# Patient Record
Sex: Male | Born: 1980 | Race: Black or African American | Hispanic: No | Marital: Single | Smoking: Current every day smoker
Health system: Southern US, Community
[De-identification: ages and names within clinical notes are randomized; demographics above are authoritative.]

## PROBLEM LIST (undated history)

## (undated) DIAGNOSIS — S199XXA Unspecified injury of neck, initial encounter: Secondary | ICD-10-CM

## (undated) DIAGNOSIS — F39 Unspecified mood [affective] disorder: Secondary | ICD-10-CM

## (undated) HISTORY — PX: NO PAST SURGERIES: SHX2092

---
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
## 2000-08-23 ENCOUNTER — Emergency Department (HOSPITAL_COMMUNITY): Admission: EM | Admit: 2000-08-23 | Discharge: 2000-08-23 | Payer: Self-pay | Admitting: Emergency Medicine

## 2002-01-08 ENCOUNTER — Inpatient Hospital Stay (HOSPITAL_COMMUNITY): Admission: AC | Admit: 2002-01-08 | Discharge: 2002-01-26 | Payer: Self-pay

## 2002-01-08 ENCOUNTER — Encounter: Payer: Self-pay | Admitting: General Surgery

## 2002-01-08 ENCOUNTER — Encounter: Payer: Self-pay | Admitting: Emergency Medicine

## 2002-01-14 ENCOUNTER — Encounter: Payer: Self-pay | Admitting: Pulmonary Disease

## 2002-01-21 ENCOUNTER — Encounter: Payer: Self-pay | Admitting: General Surgery

## 2002-01-26 ENCOUNTER — Inpatient Hospital Stay (HOSPITAL_COMMUNITY)
Admission: RE | Admit: 2002-01-26 | Discharge: 2002-02-18 | Payer: Self-pay | Admitting: Physical Medicine & Rehabilitation

## 2002-01-28 ENCOUNTER — Encounter: Payer: Self-pay | Admitting: Physical Medicine & Rehabilitation

## 2002-03-01 ENCOUNTER — Encounter: Admission: RE | Admit: 2002-03-01 | Discharge: 2002-03-01 | Payer: Self-pay | Admitting: Neurosurgery

## 2002-03-01 ENCOUNTER — Encounter: Payer: Self-pay | Admitting: Neurosurgery

## 2002-04-07 ENCOUNTER — Encounter: Admission: RE | Admit: 2002-04-07 | Discharge: 2002-04-07 | Payer: Self-pay | Admitting: Internal Medicine

## 2002-04-13 ENCOUNTER — Encounter: Payer: Self-pay | Admitting: Neurosurgery

## 2002-04-13 ENCOUNTER — Encounter: Admission: RE | Admit: 2002-04-13 | Discharge: 2002-04-13 | Payer: Self-pay | Admitting: Neurosurgery

## 2002-04-16 ENCOUNTER — Encounter: Payer: Self-pay | Admitting: Neurosurgery

## 2002-04-16 ENCOUNTER — Ambulatory Visit (HOSPITAL_COMMUNITY): Admission: RE | Admit: 2002-04-16 | Discharge: 2002-04-16 | Payer: Self-pay | Admitting: *Deleted

## 2002-05-16 ENCOUNTER — Emergency Department (HOSPITAL_COMMUNITY): Admission: EM | Admit: 2002-05-16 | Discharge: 2002-05-16 | Payer: Self-pay | Admitting: Emergency Medicine

## 2002-09-17 ENCOUNTER — Encounter
Admission: RE | Admit: 2002-09-17 | Discharge: 2002-12-16 | Payer: Self-pay | Admitting: Physical Medicine & Rehabilitation

## 2004-02-08 ENCOUNTER — Encounter: Admission: RE | Admit: 2004-02-08 | Discharge: 2004-02-08 | Payer: Self-pay | Admitting: Neurosurgery

## 2004-04-17 ENCOUNTER — Emergency Department (HOSPITAL_COMMUNITY): Admission: EM | Admit: 2004-04-17 | Discharge: 2004-04-17 | Payer: Self-pay | Admitting: Family Medicine

## 2004-06-27 ENCOUNTER — Emergency Department (HOSPITAL_COMMUNITY): Admission: EM | Admit: 2004-06-27 | Discharge: 2004-06-27 | Payer: Self-pay | Admitting: Emergency Medicine

## 2004-07-18 ENCOUNTER — Ambulatory Visit (HOSPITAL_COMMUNITY): Admission: RE | Admit: 2004-07-18 | Discharge: 2004-07-18 | Payer: Self-pay | Admitting: Orthopaedic Surgery

## 2004-08-19 ENCOUNTER — Emergency Department (HOSPITAL_COMMUNITY): Admission: EM | Admit: 2004-08-19 | Discharge: 2004-08-19 | Payer: Self-pay | Admitting: Emergency Medicine

## 2005-11-01 ENCOUNTER — Emergency Department (HOSPITAL_COMMUNITY): Admission: EM | Admit: 2005-11-01 | Discharge: 2005-11-01 | Payer: Self-pay

## 2005-11-11 ENCOUNTER — Emergency Department (HOSPITAL_COMMUNITY): Admission: EM | Admit: 2005-11-11 | Discharge: 2005-11-11 | Payer: Self-pay | Admitting: Emergency Medicine

## 2005-11-25 ENCOUNTER — Emergency Department (HOSPITAL_COMMUNITY): Admission: EM | Admit: 2005-11-25 | Discharge: 2005-11-25 | Payer: Self-pay | Admitting: Emergency Medicine

## 2005-11-26 ENCOUNTER — Emergency Department (HOSPITAL_COMMUNITY): Admission: EM | Admit: 2005-11-26 | Discharge: 2005-11-26 | Payer: Self-pay | Admitting: Emergency Medicine

## 2008-10-18 ENCOUNTER — Emergency Department (HOSPITAL_COMMUNITY): Admission: EM | Admit: 2008-10-18 | Discharge: 2008-10-18 | Payer: Self-pay | Admitting: Emergency Medicine

## 2010-04-13 ENCOUNTER — Ambulatory Visit: Admit: 2010-04-13 | Payer: Self-pay | Admitting: Nurse Practitioner

## 2010-06-24 LAB — URINALYSIS, ROUTINE W REFLEX MICROSCOPIC
Glucose, UA: NEGATIVE mg/dL
Protein, ur: NEGATIVE mg/dL
Specific Gravity, Urine: 1.021 (ref 1.005–1.030)

## 2010-06-24 LAB — COMPREHENSIVE METABOLIC PANEL
AST: 16 U/L (ref 0–37)
BUN: 7 mg/dL (ref 6–23)
CO2: 28 mEq/L (ref 19–32)
Chloride: 104 mEq/L (ref 96–112)
Creatinine, Ser: 0.61 mg/dL (ref 0.4–1.5)
GFR calc non Af Amer: >60 mL/min (ref >60)
Glucose, Bld: 86 mg/dL (ref 70–99)

## 2010-06-24 LAB — DIFFERENTIAL
Basophils Absolute: 0 10*3/uL (ref 0.0–0.1)
Eosinophils Relative: 1 % (ref 0–5)
Lymphocytes Relative: 24 % (ref 12–46)
Lymphs Abs: 1.5 10*3/uL (ref 0.7–4.0)
Monocytes Absolute: 0.3 10*3/uL (ref 0.1–1.0)
Neutro Abs: 4.3 10*3/uL (ref 1.7–7.7)
Neutrophils Relative %: 70 % (ref 43–77)

## 2010-06-24 LAB — CBC
HCT: 37.5 % — ABNORMAL LOW (ref 39.0–52.0)
Hemoglobin: 12.4 g/dL — ABNORMAL LOW (ref 13.0–17.0)
MCHC: 33 g/dL (ref 30.0–36.0)
MCV: 81.5 fL (ref 78.0–100.0)
RBC: 4.61 MIL/uL (ref 4.22–5.81)

## 2010-06-24 LAB — HEMOCCULT GUIAC POC 1CARD (OFFICE): Fecal Occult Bld: POSITIVE

## 2011-07-13 ENCOUNTER — Encounter (HOSPITAL_COMMUNITY): Payer: Self-pay | Admitting: *Deleted

## 2011-07-13 ENCOUNTER — Emergency Department (INDEPENDENT_AMBULATORY_CARE_PROVIDER_SITE_OTHER): Payer: Medicaid Other

## 2011-07-13 ENCOUNTER — Emergency Department (HOSPITAL_COMMUNITY)
Admission: EM | Admit: 2011-07-13 | Discharge: 2011-07-13 | Disposition: A | Discharge status: Home / Self Care | Payer: Medicaid Other | Source: Home / Self Care | Attending: Emergency Medicine | Admitting: Emergency Medicine

## 2011-07-13 DIAGNOSIS — M62838 Other muscle spasm: Secondary | ICD-10-CM

## 2011-07-13 HISTORY — DX: Unspecified injury of neck, initial encounter: S19.9XXA

## 2011-07-13 MED ORDER — IBUPROFEN 600 MG PO TABS: 600.0000 mg | ORAL_TABLET | Freq: Three times a day (TID) | ORAL | Status: DC | PRN

## 2011-07-13 MED ORDER — CYCLOBENZAPRINE HCL 10 MG PO TABS: 10.0000 mg | ORAL_TABLET | Freq: Two times a day (BID) | ORAL | Status: AC | PRN

## 2011-07-13 MED ORDER — ACETAMINOPHEN-CODEINE #3 300-30 MG PO TABS: 1.0000 | ORAL_TABLET | Freq: Four times a day (QID) | ORAL | Status: AC | PRN

## 2011-07-13 MED ORDER — IBUPROFEN 600 MG PO TABS: 600.0000 mg | ORAL_TABLET | Freq: Three times a day (TID) | ORAL | Status: AC | PRN

## 2011-07-13 MED ORDER — CYCLOBENZAPRINE HCL 10 MG PO TABS: 10.0000 mg | ORAL_TABLET | Freq: Two times a day (BID) | ORAL | Status: DC | PRN

## 2011-07-13 NOTE — Discharge Instructions (Signed)
Will contact you if any acute findings in your neck x-rays. Otherwise she can call us back and we can fax the results to your new primary care provider. Take the prescribed medications as instructed. Be aware that Flexeril and Tylenol #3 can make you feel drowsy and he should not drive after taking this medications. Followup with your primary care provider or spine specialist  if persistent or worsening symptoms. Can go to the emergency department if worsening symptoms despite following treatment.
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
## 2012-08-27 ENCOUNTER — Encounter (HOSPITAL_COMMUNITY): Payer: Self-pay | Admitting: Emergency Medicine

## 2012-08-27 ENCOUNTER — Emergency Department (HOSPITAL_COMMUNITY): Payer: Medicaid Other

## 2012-08-27 ENCOUNTER — Emergency Department (HOSPITAL_COMMUNITY)
Admission: EM | Admit: 2012-08-27 | Discharge: 2012-08-27 | Disposition: A | Discharge status: Home / Self Care | Payer: Medicaid Other | Attending: Emergency Medicine | Admitting: Emergency Medicine

## 2012-08-27 DIAGNOSIS — S40012A Contusion of left shoulder, initial encounter: Secondary | ICD-10-CM

## 2012-08-27 DIAGNOSIS — Z87828 Personal history of other (healed) physical injury and trauma: Secondary | ICD-10-CM | POA: Insufficient documentation

## 2012-08-27 DIAGNOSIS — IMO0002 Reserved for concepts with insufficient information to code with codable children: Secondary | ICD-10-CM | POA: Insufficient documentation

## 2012-08-27 DIAGNOSIS — Y92410 Unspecified street and highway as the place of occurrence of the external cause: Secondary | ICD-10-CM | POA: Insufficient documentation

## 2012-08-27 DIAGNOSIS — F17200 Nicotine dependence, unspecified, uncomplicated: Secondary | ICD-10-CM | POA: Insufficient documentation

## 2012-08-27 DIAGNOSIS — S40019A Contusion of unspecified shoulder, initial encounter: Secondary | ICD-10-CM | POA: Insufficient documentation

## 2012-08-27 DIAGNOSIS — Y9301 Activity, walking, marching and hiking: Secondary | ICD-10-CM | POA: Insufficient documentation

## 2012-08-27 MED ORDER — IBUPROFEN 800 MG PO TABS
800.0000 mg | ORAL_TABLET | Freq: Once | ORAL | Status: AC
  Administered 2012-08-27: 800 mg via ORAL
  Filled 2012-08-27: qty 1

## 2012-08-27 MED ORDER — NAPROXEN 500 MG PO TABS: 500.0000 mg | ORAL_TABLET | Freq: Two times a day (BID) | ORAL | Status: DC

## 2012-08-27 NOTE — ED Notes (Signed)
Pt transported to XR.  

## 2012-08-27 NOTE — ED Notes (Signed)
Pt and Mother comfortable with d/c and f/u instructions. Prescriptions x1
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
## 2012-10-20 ENCOUNTER — Emergency Department (HOSPITAL_COMMUNITY)
Admission: EM | Admit: 2012-10-20 | Discharge: 2012-10-21 | Disposition: A | Discharge status: Home / Self Care | Payer: Medicaid Other | Attending: Emergency Medicine | Admitting: Emergency Medicine

## 2012-10-20 ENCOUNTER — Emergency Department (HOSPITAL_COMMUNITY): Payer: Medicaid Other

## 2012-10-20 ENCOUNTER — Encounter (HOSPITAL_COMMUNITY): Payer: Self-pay

## 2012-10-20 DIAGNOSIS — IMO0002 Reserved for concepts with insufficient information to code with codable children: Secondary | ICD-10-CM | POA: Insufficient documentation

## 2012-10-20 DIAGNOSIS — Z87828 Personal history of other (healed) physical injury and trauma: Secondary | ICD-10-CM | POA: Insufficient documentation

## 2012-10-20 DIAGNOSIS — F919 Conduct disorder, unspecified: Secondary | ICD-10-CM | POA: Insufficient documentation

## 2012-10-20 DIAGNOSIS — R4182 Altered mental status, unspecified: Secondary | ICD-10-CM | POA: Insufficient documentation

## 2012-10-20 DIAGNOSIS — F39 Unspecified mood [affective] disorder: Secondary | ICD-10-CM | POA: Diagnosis present

## 2012-10-20 DIAGNOSIS — F17200 Nicotine dependence, unspecified, uncomplicated: Secondary | ICD-10-CM | POA: Insufficient documentation

## 2012-10-20 DIAGNOSIS — R451 Restlessness and agitation: Secondary | ICD-10-CM

## 2012-10-20 LAB — COMPREHENSIVE METABOLIC PANEL
Albumin: 3.2 g/dL — ABNORMAL LOW (ref 3.5–5.2)
BUN: 7 mg/dL (ref 6–23)
Chloride: 100 mEq/L (ref 96–112)
GFR calc non Af Amer: >90 mL/min (ref >90)
Potassium: 4.2 mEq/L (ref 3.5–5.1)
Total Bilirubin: 0.1 mg/dL — ABNORMAL LOW (ref 0.3–1.2)
Total Protein: 7.6 g/dL (ref 6.0–8.3)

## 2012-10-20 LAB — RAPID URINE DRUG SCREEN, HOSP PERFORMED
Barbiturates: NOT DETECTED
Cocaine: NOT DETECTED
Opiates: NOT DETECTED
Tetrahydrocannabinol: POSITIVE — AB

## 2012-10-20 LAB — CBC WITH DIFFERENTIAL/PLATELET
Basophils Absolute: 0 10*3/uL (ref 0.0–0.1)
Basophils Relative: 0 % (ref 0–1)
Eosinophils Relative: 2 % (ref 0–5)
MCH: 24.1 pg — ABNORMAL LOW (ref 26.0–34.0)
MCHC: 31.5 g/dL (ref 30.0–36.0)
MCV: 76.5 fL — ABNORMAL LOW (ref 78.0–100.0)
Neutro Abs: 3.3 10*3/uL (ref 1.7–7.7)
Neutrophils Relative %: 60 % (ref 43–77)
RBC: 5.23 MIL/uL (ref 4.22–5.81)
RDW: 18.1 % — ABNORMAL HIGH (ref 11.5–15.5)

## 2012-10-20 MED ORDER — ACETAMINOPHEN 325 MG PO TABS: 650.0000 mg | ORAL_TABLET | ORAL | Status: DC | PRN

## 2012-10-20 MED ORDER — ALUM & MAG HYDROXIDE-SIMETH 200-200-20 MG/5ML PO SUSP: 30.0000 mL | ORAL | Status: DC | PRN

## 2012-10-20 MED ORDER — LORAZEPAM 1 MG PO TABS: 1.0000 mg | ORAL_TABLET | Freq: Three times a day (TID) | ORAL | Status: DC | PRN

## 2012-10-20 MED ORDER — ONDANSETRON HCL 4 MG PO TABS: 4.0000 mg | ORAL_TABLET | Freq: Three times a day (TID) | ORAL | Status: DC | PRN

## 2012-10-20 MED ORDER — NICOTINE 21 MG/24HR TD PT24: 21.0000 mg | MEDICATED_PATCH | Freq: Every day | TRANSDERMAL | Status: DC

## 2012-10-20 MED ORDER — ZOLPIDEM TARTRATE 5 MG PO TABS: 5.0000 mg | ORAL_TABLET | Freq: Every evening | ORAL | Status: DC | PRN

## 2012-10-20 MED ORDER — IBUPROFEN 200 MG PO TABS: 600.0000 mg | ORAL_TABLET | Freq: Three times a day (TID) | ORAL | Status: DC | PRN

## 2012-10-20 NOTE — ED Notes (Signed)
Pt Here with [employer] IVC paper intact.  Pt denies SI/HI Pt does not know why he is here
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
## 2012-10-20 NOTE — ED Notes (Signed)
Pt offered an ativan, Pt refused

## 2012-10-20 NOTE — ED Provider Notes (Signed)
Medical screening examination/treatment/procedure(s) were performed by non-physician practitioner and as supervising physician I was immediately available for consultation/collaboration.   Richardean Canal, MD 10/20/12 2011
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
## 2012-10-21 ENCOUNTER — Encounter (HOSPITAL_COMMUNITY): Payer: Self-pay | Admitting: Registered Nurse

## 2012-10-21 DIAGNOSIS — F39 Unspecified mood [affective] disorder: Secondary | ICD-10-CM | POA: Diagnosis present

## 2012-10-21 DIAGNOSIS — IMO0002 Reserved for concepts with insufficient information to code with codable children: Secondary | ICD-10-CM

## 2012-10-21 DIAGNOSIS — F4320 Adjustment disorder, unspecified: Secondary | ICD-10-CM
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
## 2012-10-21 NOTE — ED Provider Notes (Signed)
11:24 AM D/w SW, psych has deemed patient stable for d/c with outpatient f/u. Discussed with patient, he wants to leave and denies SI/HI. Stable for discharge.  Audree Camel, MD 10/21/12 [phone]

## 2012-10-21 NOTE — Consult Note (Signed)
I have personally seen the patient and agreed with the findings and involved in the treatment plan. Dino Borntreger, MD 
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
## 2012-11-03 ENCOUNTER — Encounter (HOSPITAL_COMMUNITY): Payer: Self-pay

## 2012-11-03 ENCOUNTER — Emergency Department (HOSPITAL_COMMUNITY)
Admission: EM | Admit: 2012-11-03 | Discharge: 2012-11-06 | Disposition: A | Discharge status: Other Acute Inpatient Hospital | Payer: Medicaid Other | Attending: Emergency Medicine | Admitting: Emergency Medicine

## 2012-11-03 DIAGNOSIS — F39 Unspecified mood [affective] disorder: Secondary | ICD-10-CM | POA: Insufficient documentation

## 2012-11-03 DIAGNOSIS — F17200 Nicotine dependence, unspecified, uncomplicated: Secondary | ICD-10-CM | POA: Insufficient documentation

## 2012-11-03 HISTORY — DX: Unspecified mood (affective) disorder: F39

## 2012-11-03 LAB — RAPID URINE DRUG SCREEN, HOSP PERFORMED
Amphetamines: NOT DETECTED
Barbiturates: NOT DETECTED
Tetrahydrocannabinol: POSITIVE — AB

## 2012-11-03 LAB — COMPREHENSIVE METABOLIC PANEL
AST: 13 U/L (ref 0–37)
Albumin: 3.1 g/dL — ABNORMAL LOW (ref 3.5–5.2)
Alkaline Phosphatase: 87 U/L (ref 39–117)
BUN: 13 mg/dL (ref 6–23)
Chloride: 101 mEq/L (ref 96–112)
Potassium: 3.4 mEq/L — ABNORMAL LOW (ref 3.5–5.1)
Total Bilirubin: 0.2 mg/dL — ABNORMAL LOW (ref 0.3–1.2)

## 2012-11-03 LAB — CBC
HCT: 36.9 % — ABNORMAL LOW (ref 39.0–52.0)
RDW: 18 % — ABNORMAL HIGH (ref 11.5–15.5)
WBC: 6 10*3/uL (ref 4.0–10.5)
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
## 2012-11-03 NOTE — ED Notes (Signed)
Patient has 1 bag of belongings in locker #29 in [place].

## 2012-11-03 NOTE — ED Notes (Signed)
Patient in blue scrubs and red socks.  

## 2012-11-03 NOTE — ED Notes (Signed)
Patient and belongings both wanded by security.  

## 2012-11-04 DIAGNOSIS — F39 Unspecified mood [affective] disorder: Secondary | ICD-10-CM

## 2012-11-04 MED ORDER — OLANZAPINE 10 MG IM SOLR: 10.0000 mg | Freq: Once | INTRAMUSCULAR | Status: AC | PRN

## 2012-11-04 NOTE — ED Notes (Signed)
[facility] PA made aware there is no medications ordered for this patient. SHe was unable to find any hx in his current chart and said the evening PA will have to order him medications.

## 2012-11-04 NOTE — ED Notes (Signed)
Donell Sievert, PA in with patient.

## 2012-11-04 NOTE — Consult Note (Signed)
[facility] Psychiatry Consult   Reason for Consult:  Evaluation for IP psychiatric Mgmt Referring Physician:  [facility] EDP  Tyler Dominguez is an 32 y.o. male.  Assessment: AXIS I:  Mood Disorder NOS AXIS II:  No diagnosis AXIS III:   Past Medical History  Diagnosis Date  . Mood disorder neck injury    AXIS IV:  other psychosocial or environmental problems AXIS V:  11-20 some danger of hurting self or others possible OR occasionally fails to maintain minimal personal hygiene OR gross impairment in communication  Plan:  Recommend psychiatric Inpatient admission when medically cleared.  Subjective:   Tyler Dominguez is a 32 y.o. male patient presenting to [facility] ED under IVC papers taken out by his mother due to aggressive, impulsive behaviors and irritability towards his motherso claim that the patient refuses to take medication for his Dx mood D/o. The patient denies any SI/SA or HI as well as any AVH. The patient states that he can contract for safety at this time, but this has been his second admission to the [facility] ED in the last month. The patient has previously been seen at [facility].  HPI:  HPI Elements:     Past Psychiatric History: Past Medical History  Diagnosis Date  . Mood disorder neck injury     reports that he has been smoking Cigarettes.  He has been smoking about 0.00 packs per day. He does not have any smokeless tobacco history on file. He reports that he uses illicit drugs (Marijuana). He reports that he does not drink alcohol. No family history on file.         Allergies:  No Known Allergies  Past Psychiatric History: Diagnosis:  MOOD d/o  Hospitalizations:  [facility]  Outpatient Care: no  Substance Abuse Care: none  Self-Mutilation:  no  Suicidal Attempts:  no  Violent Behaviors: yes   Objective: Blood pressure 119/66, pulse 65, temperature 97.7 F (36.5 C), temperature source Oral, resp. rate 15, SpO2 100.00%.There is no height or weight on file to calculate BMI. Results  for orders placed during the hospital encounter of 11/03/12 (from the past 72 hour(s))  CBC     Status: Abnormal   Collection Time    11/03/12  4:50 PM      Result Value Range   WBC 6.0  4.0 - 10.5 K/uL   RBC 4.87  4.22 - 5.81 MIL/uL   Hemoglobin 12.0 (*) 13.0 - 17.0 g/dL   HCT 95.6 (*) 21.3 - 08.6 %   MCV 75.8 (*) 78.0 - 100.0 fL   MCH 24.6 (*) 26.0 - 34.0 pg   MCHC 32.5  30.0 - 36.0 g/dL   RDW 57.8 (*) 46.9 - 62.9 %   Platelets 392  150 - 400 K/uL  COMPREHENSIVE METABOLIC PANEL     Status: Abnormal   Collection Time    11/03/12  4:50 PM      Result Value Range   Sodium 138  135 - 145 mEq/L   Potassium 3.4 (*) 3.5 - 5.1 mEq/L   Chloride 101  96 - 112 mEq/L   CO2 28  19 - 32 mEq/L   Glucose, Bld 83  70 - 99 mg/dL   BUN 13  6 - 23 mg/dL   Creatinine, Ser 5.28  0.50 - 1.35 mg/dL   Calcium 9.0  8.4 - 41.3 mg/dL   Total Protein 7.4  6.0 - 8.3 g/dL   Albumin 3.1 (*) 3.5 - 5.2 g/dL   AST 13  0 - 37 U/L   ALT 10  0 - 53 U/L   Alkaline Phosphatase 87  39 - 117 U/L   Total Bilirubin 0.2 (*) 0.3 - 1.2 mg/dL   GFR calc non Af Amer >90  >90 mL/min   GFR calc Af Amer >90  >90 mL/min   Comment: (NOTE)     The eGFR has been calculated using the CKD EPI equation.     This calculation has not been validated in all clinical situations.     eGFR's persistently <90 mL/min signify possible Chronic Kidney     Disease.  ETHANOL     Status: None   Collection Time    11/03/12  4:50 PM      Result Value Range   Alcohol, Ethyl (B) <11  0 - 11 mg/dL   Comment:            LOWEST DETECTABLE LIMIT FOR     SERUM ALCOHOL IS 11 mg/dL     FOR MEDICAL PURPOSES ONLY  URINE RAPID DRUG SCREEN (HOSP PERFORMED)     Status: Abnormal   Collection Time    11/03/12  6:58 PM      Result Value Range   Opiates NONE DETECTED  NONE DETECTED   Cocaine NONE DETECTED  NONE DETECTED   Benzodiazepines NONE DETECTED  NONE DETECTED   Amphetamines NONE DETECTED  NONE DETECTED   Tetrahydrocannabinol POSITIVE (*) NONE  DETECTED   Barbiturates NONE DETECTED  NONE DETECTED   Comment:            DRUG SCREEN FOR MEDICAL PURPOSES     ONLY.  IF CONFIRMATION IS NEEDED     FOR ANY PURPOSE, NOTIFY LAB     WITHIN 5 DAYS.                LOWEST DETECTABLE LIMITS     FOR URINE DRUG SCREEN     Drug Class       Cutoff (ng/mL)     Amphetamine      1000     Barbiturate      200     Benzodiazepine   200     Tricyclics       300     Opiates          300     Cocaine          300     THC              50   Labs are reviewed and are pertinent for ( No critical lab values noted)  No current facility-administered medications for this encounter.   No current outpatient prescriptions on file.    Psychiatric Specialty Exam:     Blood pressure 119/66, pulse 65, temperature 97.7 F (36.5 C), temperature source Oral, resp. rate 15, SpO2 100.00%.There is no height or weight on file to calculate BMI.  General Appearance: Fairly Groomed  Patent attorney::  Good  Speech:  Garbled  Volume:  Normal  Mood:  Angry  Affect:  Congruent  Thought Process:  Disorganized  Orientation:  Full (Time, Place, and Person)  Thought Content:  Rumination  Suicidal Thoughts:  No  Homicidal Thoughts:  No  Memory:  Immediate;   Fair  Judgement:  Poor  Insight:  Shallow  Psychomotor Activity:  Negative  Concentration:  Fair  Recall:  Fair  Akathisia:  Negative  Handed:  Right  AIMS (if indicated):     Assets:  Sleep:      Treatment Plan Summary: 1) Under IVC recommend crises mgmt, safety and stabilization of mood d/o [address] [facility] pending bed 2) Administration of psychotropics and psychotherapeutic interventions 3) Social work to aid in OP support services and psychiatric mgmt  SIMON,SPENCER E 11/04/2012 5:37 AM  I agreed with the findings, treatment and disposition plan of this patient. Kathryne Sharper, MD

## 2012-11-04 NOTE — ED Provider Notes (Signed)
Medical screening examination/treatment/procedure(s) were performed by non-physician practitioner and as supervising physician I was immediately available for consultation/collaboration.  Virlan Kempker N Yuri Flener, DO 11/04/12 0010 

## 2012-11-04 NOTE — ED Notes (Addendum)
Pt became angry when mother came to visit for the second time today.  Pt slammed table into the hallway and began yelling and cussing asking why he was going to be moved to another facility. Pt mother left immediately. Pt came to nurses station and asked why he was not going home. This pt  Came to Nurses station and asked why he wasn't going home. Staff said maybe to adjust your medications. Pt Hit window of nurses station and said "I told you dumb ass people I dont take any medications, why dont you get it through your fucking head!" Pt walked back to room.

## 2012-11-04 NOTE — ED Notes (Addendum)
Patient became very loud and was cursing because he stated that he was ready to go home. Patient states " look at me how am I going to hurt someone, I got to many medical problems" I also talk loud and use facial expression to express when  I'm trying to get people to understand me.

## 2012-11-04 NOTE — ED Notes (Signed)
Pt mother visiting in room.

## 2012-11-04 NOTE — ED Notes (Signed)
Pt called mother on the phone and raised his voice and was asking why she took out papers on him. Pt was pacing back and forth and had  heightened  Anxiety.

## 2012-11-04 NOTE — Progress Notes (Signed)
[facility] provided patient with a [employer] Orange Card application, highlighting [facility].  °

## 2012-11-05 DIAGNOSIS — F429 Obsessive-compulsive disorder, unspecified: Secondary | ICD-10-CM

## 2012-11-05 DIAGNOSIS — F22 Delusional disorders: Secondary | ICD-10-CM

## 2012-11-05 MED ORDER — DIVALPROEX SODIUM ER 500 MG PO TB24
500.0000 mg | ORAL_TABLET | Freq: Every day | ORAL | Status: DC
  Administered 2012-11-05 – 2012-11-06 (×2): 500 mg via ORAL
  Filled 2012-11-05 (×3): qty 1

## 2012-11-05 MED ORDER — LORAZEPAM 1 MG PO TABS: 2.0000 mg | ORAL_TABLET | Freq: Four times a day (QID) | ORAL | Status: DC | PRN

## 2012-11-05 MED ORDER — TRAZODONE HCL 50 MG PO TABS: 50.0000 mg | ORAL_TABLET | Freq: Every evening | ORAL | Status: DC | PRN

## 2012-11-05 NOTE — Progress Notes (Signed)
Face to face interview and consult with Dr. Ladona Ridgel  Patient states that he is feeling good today.  Patient denies suicidal ideation, homicidal ideation, psychosis, and paranoia.  Patient states that he is ready to go home.  Patient continues to demonstrate pressured speech, and paranoia and obsession with time.  Will continue to monitor and continue with current treatment plan for inpatient treatment.
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
## 2012-11-06 ENCOUNTER — Inpatient Hospital Stay (HOSPITAL_COMMUNITY)
Admission: AD | Admit: 2012-11-06 | Discharge: 2012-11-12 | DRG: 885 | Disposition: A | Discharge status: Home / Self Care | Payer: Medicaid Other | Source: Ambulatory Visit | Attending: Psychiatry | Admitting: Psychiatry

## 2012-11-06 ENCOUNTER — Encounter (HOSPITAL_COMMUNITY): Payer: Self-pay

## 2012-11-06 DIAGNOSIS — F1210 Cannabis abuse, uncomplicated: Secondary | ICD-10-CM

## 2012-11-06 DIAGNOSIS — F29 Unspecified psychosis not due to a substance or known physiological condition: Secondary | ICD-10-CM

## 2012-11-06 DIAGNOSIS — Z79899 Other long term (current) drug therapy: Secondary | ICD-10-CM

## 2012-11-06 DIAGNOSIS — F411 Generalized anxiety disorder: Secondary | ICD-10-CM

## 2012-11-06 DIAGNOSIS — F319 Bipolar disorder, unspecified: Principal | ICD-10-CM

## 2012-11-06 MED ORDER — RISPERIDONE 1 MG PO TABS: 1.0000 mg | ORAL_TABLET | Freq: Every day | ORAL | Status: DC

## 2012-11-06 MED ORDER — HYDROXYZINE HCL 50 MG PO TABS
50.0000 mg | ORAL_TABLET | Freq: Every evening | ORAL | Status: DC | PRN
  Filled 2012-11-06: qty 3

## 2012-11-06 MED ORDER — LORAZEPAM 1 MG PO TABS: 2.0000 mg | ORAL_TABLET | Freq: Four times a day (QID) | ORAL | Status: DC | PRN

## 2012-11-06 MED ORDER — ACETAMINOPHEN 325 MG PO TABS: 650.0000 mg | ORAL_TABLET | Freq: Four times a day (QID) | ORAL | Status: DC | PRN

## 2012-11-06 MED ORDER — NICOTINE 14 MG/24HR TD PT24
14.0000 mg | MEDICATED_PATCH | Freq: Every day | TRANSDERMAL | Status: DC
  Filled 2012-11-06 (×7): qty 1

## 2012-11-06 MED ORDER — TRAZODONE HCL 50 MG PO TABS
50.0000 mg | ORAL_TABLET | Freq: Every evening | ORAL | Status: DC | PRN
  Administered 2012-11-10: 50 mg via ORAL
  Filled 2012-11-06 (×11): qty 1

## 2012-11-06 MED ORDER — MAGNESIUM HYDROXIDE 400 MG/5ML PO SUSP: 30.0000 mL | Freq: Every day | ORAL | Status: DC | PRN

## 2012-11-06 MED ORDER — DIVALPROEX SODIUM 500 MG PO DR TAB: 500.0000 mg | DELAYED_RELEASE_TABLET | Freq: Two times a day (BID) | ORAL | Status: DC

## 2012-11-06 MED ORDER — ALUM & MAG HYDROXIDE-SIMETH 200-200-20 MG/5ML PO SUSP: 30.0000 mL | ORAL | Status: DC | PRN

## 2012-11-06 NOTE — Progress Notes (Signed)
Patient ID: Tyler Dominguez, male   DOB: September 09, 1980, 32 y.o.   MRN: [id number] Saw patient on rounds with Dr Ladona Ridgel this am.  He was calm and cooperative but exhibited pressured speech.  He is constantly using the phone and calling somebody to come take him home.  He appears anxious and  Disheveled.  He denies SI/HI/AVH.  He exhibits some paranoia looking out of his room standing at the door.  We will continue to wait for inpatient bed for admission. Dahlia Byes  PMHNP-BC

## 2012-11-06 NOTE — Progress Notes (Signed)
Adult Psychoeducational Group Note  Date:  11/06/2012 Time:  9:36 PM  Group Topic/Focus:  Wrap-Up Group:   The focus of this group is to help patients review their daily goal of treatment and discuss progress on daily workbooks.  Participation Level:  Active  Participation Quality:  Appropriate  Affect:  Appropriate  Cognitive:  Disorganized  Insight: Improving  Engagement in Group:  Improving  Modes of Intervention:  Discussion  Additional Comments:  Patient appeared to be relaxed in group. Patient was difficult to understand logically, but patient did state that this was his second admission here.    Lyndee Hensen 11/06/2012, 9:36 PM
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
## 2012-11-07 ENCOUNTER — Encounter (HOSPITAL_COMMUNITY): Payer: Self-pay | Admitting: Psychiatry

## 2012-11-07 DIAGNOSIS — F29 Unspecified psychosis not due to a substance or known physiological condition: Secondary | ICD-10-CM | POA: Diagnosis present

## 2012-11-07 DIAGNOSIS — F39 Unspecified mood [affective] disorder: Secondary | ICD-10-CM

## 2012-11-07 DIAGNOSIS — F319 Bipolar disorder, unspecified: Principal | ICD-10-CM | POA: Diagnosis present

## 2012-11-07 MED ORDER — RISPERIDONE 1 MG PO TABS
1.0000 mg | ORAL_TABLET | Freq: Two times a day (BID) | ORAL | Status: DC
  Administered 2012-11-08 – 2012-11-09 (×2): 1 mg via ORAL
  Filled 2012-11-07 (×6): qty 1

## 2012-11-07 NOTE — Progress Notes (Signed)
Addendum note ; patient refused haldol " I told you I don't take meds" encouraged pt to take mediation." Why do you try to poison me."

## 2012-11-07 NOTE — [facility] Suicide Risk Assessment (Signed)
Suicide Risk Assessment  Admission Assessment     Nursing information obtained from:    Demographic factors:    Current Mental Status:    Loss Factors:    Historical Factors:    Risk Reduction Factors:     CLINICAL FACTORS:   Bipolar Disorder:   Mixed State Currently Psychotic  COGNITIVE FEATURES THAT CONTRIBUTE TO RISK:  Closed-mindedness Polarized thinking Thought constriction (tunnel vision)    SUICIDE RISK:   Moderate:  Frequent suicidal ideation with limited intensity, and duration, some specificity in terms of plans, no associated intent, good self-control, limited dysphoria/symptomatology, some risk factors present, and identifiable protective factors, including available and accessible social support.  PLAN OF CARE: Supportive approach/coping skills                               Get more information                               Reassess and optimize treatment with psychotropics  I certify that inpatient services furnished can reasonably be expected to improve the patient's condition.  Dimitra Woodstock A 11/07/2012, 3:57 PM

## 2012-11-07 NOTE — [facility] Group Notes (Signed)
[facility] Group Notes:  (Nursing/MHT/Case Management/Adjunct)  Date:  11/07/2012  Time:  11:08 AM  Type of Therapy:  Psychoeducational Skills  Participation Level:  Minimal  Participation Quality:  Inattentive  Affect:  Irritable  Cognitive:  Disorganized  Insight:  None  Engagement in Group:  Defensive  Modes of Intervention:  Discussion and Education  Summary of Progress/Problems: developing healthy coping skills thru education and discussion reviewed self inventory sheet.  Jimmey Ralph 11/07/2012, 11:08 AM
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
## 2012-11-07 NOTE — [facility] Group Notes (Signed)
[facility] Group Notes:  (Clinical Social Work)  11/07/2012  11:15-11:45AM  Summary of Progress/Problems:   The main focus of today's process group was for the patient to identify ways in which they have in the past sabotaged their own recovery and reasons they may have done this/what they received from doing it.  We then worked to identify a specific plan to avoid doing this when discharged from the hospital for this admission.  The patient expressed that he has never self-sabotaged, and immediately following an explanation of the concept, asked what it was.  He spoke about other unrelated items, did not follow discussion at all, was pleasant but appeared to be responding to internal stimuli.  Type of Therapy:  Group Therapy - Process  Participation Level:  Minimal  Participation Quality:  Inattentive  Affect:  Appropriate and Excited  Cognitive:  Disorganized  Insight:  Limited  Engagement in Therapy:  Limited  Modes of Intervention:  Clarification, Education, Exploration, Discussion  Ambrose Mantle, LCSW 11/07/2012, 11:58 AM

## 2012-11-07 NOTE — Progress Notes (Signed)
Nursing Progress note ;(7a-7p) D- Patient present with inappropriate affect, speech pressured and rambling. Difficult to follow gets irritable when needs aren't meet. "I don't need to be here and I  don't need any meds because my heads just fine it's my people that's crazy taking my money in my business.".  A-Allowed patient to ventilate and support given. Encourage to continue with treatment plan and group activities  R- Will continue to monitor on q 15 minute checks for safety and educate pt the need for medication compliance.

## 2012-11-07 NOTE — Progress Notes (Signed)
Pt remains resistant to care, refusing meds. "I already took like 4 pills today" though chart reflects he refused prior meds offered. Pt tangential, disorganized and difficult to understand. Speech remains pressured and rapid.  Pt displays paranoia and asks about discharge throughout conversation which is largely dominated by patient. Mood is irritable though pt is not a behavioral problem at this time. Supported, redirected as needed. Denies SI/HI/AVH as he displays little to no insight. Lawrence Marseilles

## 2012-11-07 NOTE — Progress Notes (Signed)
Adult Psychoeducational Group Note  Date:  11/07/2012 Time:  9:53 PM  Group Topic/Focus:  Wrap-Up Group:   The focus of this group is to help patients review their daily goal of treatment and discuss progress on daily workbooks.  Participation Level:  Minimal  Participation Quality:  Appropriate  Affect:  Irritable  Cognitive:  Disorganized and Delusional  Insight: Lacking  Engagement in Group:  Developing/Improving  Modes of Intervention:  Discussion  Additional Comments:  Patient stated that he achieved his goal of "staying to myself." Patient shared that he hopes to go home on Monday.   Lyndee Hensen 11/07/2012, 9:53 PM

## 2012-11-08 DIAGNOSIS — F319 Bipolar disorder, unspecified: Principal | ICD-10-CM
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
## 2012-11-08 NOTE — Progress Notes (Signed)
Patient ID: Tyler Dominguez, male   DOB: 03-Jun-1980, 32 y.o.   MRN: [id number]  D: Patient withdrawn and reclusive to his room. Pt irritable and with disorganized thoughts. A: Monitor pt Q 15 minutes for safety, encourage staff/peer interaction and group participation. Administer medications as ordered. R: Pt remains isolative and irritable; no distress noted.

## 2012-11-08 NOTE — Progress Notes (Signed)
Pt remains tangential in his thinking and speech. Paranoid. Talks fearfully about the police. Resistant to care, no insight into present illness. Didn't wish to share in group stating, "I said 2 things in 2 groups earlier." Avoids eye contact and interaction with staff though is social with peers. Not a behavioral problem on unit. Supported and encouraged. Offered trazadone for sleep but pt refused. Denies SI/HI/AVH and remains safe. Lawrence Marse[place]

## 2012-11-08 NOTE — [facility] Group Notes (Signed)
[facility] Group Notes: (Clinical Social Work)   11/08/2012      Type of Therapy:  Group Therapy   Participation Level:  Did Not Attend    Ambrose Mantle, LCSW 11/08/2012, 1:19 PM
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
## 2012-11-09 DIAGNOSIS — F319 Bipolar disorder, unspecified: Secondary | ICD-10-CM

## 2012-11-09 MED ORDER — RISPERIDONE 2 MG PO TABS
2.0000 mg | ORAL_TABLET | Freq: Two times a day (BID) | ORAL | Status: DC
  Administered 2012-11-09 – 2012-11-12 (×6): 2 mg via ORAL
  Filled 2012-11-09 (×3): qty 1
  Filled 2012-11-09: qty 6
  Filled 2012-11-09 (×3): qty 1
  Filled 2012-11-09: qty 6
  Filled 2012-11-09 (×3): qty 1

## 2012-11-09 NOTE — Progress Notes (Signed)
D: Pt denies SI/HI/AVH. Pt has poor insight. Tangential thoughts and speech. Pt blaming others for being here. Pt presents with flat affect. Anxious mood (fidgety). Continues to ask if he can go home. Pt resistant to taking medications. Writer informed pt about his treatment plan and the importance of taking meds. Pt then agreed to take med. A: Medications administered as ordered per MD. Verbal support given. Pt encouraged to attend groups. 15 minute checks performed for safety. R: Pt remains safe at this time. No complaints verbalized by pt.

## 2012-11-09 NOTE — Progress Notes (Signed)
Recreation Therapy Notes  Date: 08.25.2014 Time: 9:30am Location: [address] Dayroom  Group Topic: Leisure Education  Goal Area(s) Addresses:  Patient will verbalize activity of interest by end of group session. Patient will verbalize the ability to use positive leisure/recreation as a coping mechanism.  Behavioral Response: Observation  Intervention: Adapted Game  Activity: Words of Leisure. LRT wrote the words "Recreation & Leisure" on the white board in the dayroom. Patients were asked to create as many words as possible used the letters of "Recreation & Leisure."   Education:  Leisure Education, Discharge Planning  Education Outcome: Needs additional education  Clinical Observations/Feedback: Patient arrived to group session at approximately 9:40am. Upon arriving patient observed group session.     Marykay Lex Jonnie Truxillo, LRT/CTRS  Jearl Klinefelter 11/09/2012 12:32 PM

## 2012-11-09 NOTE — Progress Notes (Signed)
The focus of this group is to help patients review their daily goal of treatment and discuss progress on daily workbooks. Pt attended the evening group session and responded to all discussion prompts from the Writer. Pt reported having a good day, the highlight of which was learning that he will likely be discharged tomorrow and being able to go outside to the courtyard. Pt reported his only need this evening was to sleep well. Pt's affect was neutral and far more calm than in previous wrap-up groups.

## 2012-11-09 NOTE — Progress Notes (Deleted)
Recreation Therapy Notes  Date: 08.25.2014 Time: 9:30am Location: [address] Dayroom  Group Topic: Leisure Education  Goal Area(s) Addresses:  Patient will verbalize activity of interest by end of group session. Patient will verbalize the ability to use positive leisure/recreation as a coping mechanism.  Behavioral Response: Sleeping  Intervention: Adapted Game  Activity: Words of Leisure. LRT wrote the words "Recreation & Leisure" on the white board in the dayroom. Patients were asked to create as many words as possible used the letters of "Recreation & Leisure."   Education:  Leisure Education, Discharge Planning  Education Outcome: Needs additional education  Clinical Observations/Feedback: Patient attended group session, but did not engage in activity. Patient stated name to LRT, following that interaction patient was observed to lean his head against the wall and appeared to sleep. Patient maintained posture during group session.    Marykay Lex Nasira Janusz, LRT/CTRS  Dezeray Puccio L 11/09/2012 12:22 PM

## 2012-11-09 NOTE — Progress Notes (Signed)
Adult Psychoeducational Group Note  Date:  11/09/2012 Time:  4:13 AM  Group Topic/Focus:  Wrap-Up Group:   The focus of this group is to help patients review their daily goal of treatment and discuss progress on daily workbooks.  Participation Level:  None  Participation Quality:  none  Affect:  Flat  Cognitive:  Lacking  Insight: Limited  Engagement in Group:  none  Modes of Intervention:  Support  Additional Comments:  Patient attended group, however, did not participated. He reports that she already participated in two earlier group.  Lita Mains [facility] 11/09/2012, 4:13 AM

## 2012-11-09 NOTE — [facility] Group Notes (Signed)
[facility] LCSW Aftercare Discharge Planning Group Note   11/09/2012 8:17 AM  Participation Quality:  Engaged  Mood/Affect:  Flat  Depression Rating:  denies  Anxiety Rating:  denies  Thoughts of Suicide:  No Will you contract for safety?   NA  Current AVH:  Denies, but presents as disorganized  Plan for Discharge/Comments:  Tyler Dominguez mumbles and is disorganized, so it is difficult to figure out what he is saying.  He is here due to "family problems," and says something about a stopped check when he was in jail for public intoxication.  When I point out disability checks resume when sprung from jail, he acknowledges this to be true.  Talked about a "heavy set woman who collects the rent money."  Says his mom checks on him daily at his crib.  Very focused on getting a bus pass to get out of here.  Explained the process to him.  Not sure how much of it he was able to follow as he continues to inquire about a bus pass.  Transportation Means:  bus  Supports: family  [place], [name]
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
## 2012-11-09 NOTE — Progress Notes (Signed)
Patient ID: Tyler Dominguez, male   DOB: Feb 16, 1981, 32 y.o.   MRN: [id number]  D: Writer went over some info with given by previous RN as well as date of adm. Pt informed the writer that he was in "the other hosp for 4 days". Stated he wasn't on any medications prior to coming to [facility], but explained that he takes it while in here in hopes of getting discharged.  However, it appears that pt is disorganized, and tangential at times. Pt began talking about his mom, but Clinical research associate found it difficult to follow.   A:  Support and encouragement was offered. 15 min checks continued for safety.  R: Pt remains safe.

## 2012-11-10 DIAGNOSIS — F1210 Cannabis abuse, uncomplicated: Secondary | ICD-10-CM | POA: Diagnosis present

## 2012-11-10 MED ORDER — TRAZODONE HCL 100 MG PO TABS
100.0000 mg | ORAL_TABLET | Freq: Every day | ORAL | Status: DC
  Filled 2012-11-10: qty 1
  Filled 2012-11-10: qty 3
  Filled 2012-11-10 (×2): qty 1

## 2012-11-10 MED ORDER — OXCARBAZEPINE 150 MG PO TABS
150.0000 mg | ORAL_TABLET | Freq: Two times a day (BID) | ORAL | Status: DC
  Administered 2012-11-10 – 2012-11-12 (×4): 150 mg via ORAL
  Filled 2012-11-10 (×2): qty 6
  Filled 2012-11-10 (×6): qty 1
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
## 2012-11-10 NOTE — [facility] Group Notes (Signed)
[facility] LCSW Group Therapy  11/10/2012 , 5:15 PM   Type of Therapy:  Group Therapy  Participation Level:  Minimal  Participation Quality:  Attentive  Affect:  Appropriate  Cognitive:  Alert  Insight: None  Engagement in Therapy: None  Modes of Intervention:  Discussion, Exploration and Socialization  Summary of Progress/Problems: Today's group focused on the term Diagnosis.  Participants were asked to define the term, and then pronounce whether it is a negative, positive or neutral term.  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  Daryel Gerald B 11/10/2012 , 5:15 PM

## 2012-11-10 NOTE — Progress Notes (Signed)
D: Pt denies SI/HI/AVH. Pt has poor insight. Pt is cautious. Pt compliant with taking meds after encouragement by Clinical research associate. Pt present with rapid speech. Flight of ideas. Loose association. Fidgety. Pt needs redirecting by staff for inappropriate behaviors. A: Medications administered as ordered per MD. Verbal support given. Pt encouraged to attend groups. 15 minute checks performed for safety. R: Pt remains safe at this time.

## 2012-11-10 NOTE — [facility] Group Notes (Signed)
Adult Psychoeducational Group Note  Date:  11/10/2012 Time:  9:49 PM  Group Topic/Focus:  Wrap-Up Group:   The focus of this group is to help patients review their daily goal of treatment and discuss progress on daily workbooks.  Participation Level:  Minimal  Participation Quality:  Attentive  Affect:  Flat  Cognitive:  Alert  Insight: Limited  Engagement in Group:  Limited  Modes of Intervention:  Discussion  Additional Comments:  Tyler Dominguez stated he had a good visit.  He thought he might go home today but hopes to go home by the end of the week.  [name] A 11/10/2012, 9:49 PM

## 2012-11-10 NOTE — Progress Notes (Signed)
Patient ID: Tyler Dominguez, male   DOB: [birth year], 32 y.o.   MRN: [id number]  D: Asked the pt how his day had been going. "Mom Duke came and met with Rod, so she got the scoop.  But you ever come in here again and turn on those damn lights, referring to Monday when the writer entered the room to introduce herself. Pt also made a comment about the writer's lips, however writer could not understand what was said.  A:  Support and encouragement was offered. 15 min checks continued for safety.  R: Pt remains safe.
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
## 2012-11-11 NOTE — Progress Notes (Signed)
The focus of this group is to help patients review their daily goal of treatment and discuss progress on daily workbooks. Pt attended the evening group session and responded to all discussion prompts from the Writer. Pt shared that he had a good day today and was excited about possibly being discharged tomorrow. Pt mumbled much of what he said in group, but repeated several times "I made it!" Pt's affect was bright.
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
## 2012-11-11 NOTE — [facility] Group Notes (Signed)
[facility] [employer] Group Therapy  11/11/2012 , 3:16 PM    Type of Therapy:  Mental [employer] Presentation  Participation Level:  Did not attend group.    Daryel Gerald B 11/11/2012 , 3:16 PM

## 2012-11-11 NOTE — Progress Notes (Signed)
Seen and agreed. Cassius Cullinane, MD 

## 2012-11-11 NOTE — Progress Notes (Signed)
[facility] Adult Case Management Discharge Plan :  Will you be returning to the same living situation after discharge: Yes,  home At discharge, do you have transportation home?:Yes,  bus pass Do you have the ability to pay for your medications:Yes,  yes MCD  Release of information consent forms completed and in the chart;  Patient's signature needed at discharge.  Patient to Follow up at:   Patient denies SI/HI:   Yes,  yes    Safety Planning and Suicide Prevention discussed:  Yes,  yes  Ida Rogue 11/11/2012, 3:51 PM

## 2012-11-11 NOTE — Tx Team (Signed)
  Interdisciplinary Treatment Plan Update   Date Reviewed:  11/11/2012  Time Reviewed:  3:48 PM  Progress in Treatment:   Attending groups: Yes Participating in groups: Yes Taking medication as prescribed: Yes  Tolerating medication: Yes Family/Significant other contact made: Yes  Patient understands diagnosis: Yes  Discussing patient identified problems/goals with staff: Yes Medical problems stabilized or resolved: Yes Denies suicidal/homicidal ideation: Yes Patient has not harmed self or others: Yes  For review of initial/current patient goals, please see plan of care.  Estimated Length of Stay:  D/C tomorrow  Reason for Continuation of Hospitalization:   New Problems/Goals identified:  N/A  Discharge Plan or Barriers:   return home, follow up outpt  Additional Comments:  Attendees:  Signature: Thedore Mins, MD 11/11/2012 3:48 PM   Signature: Richelle Ito, LCSW 11/11/2012 3:48 PM  Signature: Fransisca Kaufmann, NP 11/11/2012 3:48 PM  Signature: 11/11/2012 3:48 PM  Signature: Liborio Nixon, RN 11/11/2012 3:48 PM  Signature:  11/11/2012 3:48 PM  Signature:   11/11/2012 3:48 PM  Signature:    Signature:    Signature:    Signature:    Signature:    Signature:      Scribe for Treatment Team:   Richelle Ito, LCSW  11/11/2012 3:48 PM

## 2012-11-11 NOTE — Progress Notes (Signed)
D:  Pt denies SI/HI/AVH. Pt presents with anxious affect and mood. Pt has rapid pressure speech. Pt is making progress. Pt thoughts are less disorganized this morning. Pt compliant with taking meds and attending groups. A: Medications administered as ordered per MD. Verbal support given. Pt encouraged to attend groups. 15 minute checks performed for safety. R: Pt remains safe at this time. No complaints verbalized by pt.

## 2012-11-11 NOTE — [facility] Suicide Risk Assessment (Signed)
[facility] INPATIENT:  Family/Significant Other Suicide Prevention Education  Suicide Prevention Education:  Education Completed; No one has been identified by the patient as the family member/significant other with whom the patient will be residing, and identified as the person(s) who will aid the patient in the event of a mental health crisis (suicidal ideations/suicide attempt).  With written consent from the patient, the family member/significant other has been provided the following suicide prevention education, prior to the and/or following the discharge of the patient.  The suicide prevention education provided includes the following:  Suicide risk factors  Suicide prevention and interventions  National Suicide Hotline telephone number  [facility] assessment telephone number  [facility] Emergency Assistance 911  [facility] and/or Residential Mobile Crisis Unit telephone number  Request made of family/significant other to:  Remove weapons (e.g., guns, rifles, knives), all items previously/currently identified as safety concern.    Remove drugs/medications (over-the-counter, prescriptions, illicit drugs), all items previously/currently identified as a safety concern.  The family member/significant other verbalizes understanding of the suicide prevention education information provided.  The family member/significant other agrees to remove the items of safety concern listed above. The patient did not endorse SI at the time of admission, nor did the patient c/o SI during the stay here.  SPE not required.   Daryel Gerald B 11/11/2012, 3:50 PM

## 2012-11-12 ENCOUNTER — Encounter (HOSPITAL_COMMUNITY): Payer: Self-pay | Admitting: Registered Nurse

## 2012-11-12 DIAGNOSIS — F1210 Cannabis abuse, uncomplicated: Secondary | ICD-10-CM

## 2012-11-12 MED ORDER — RISPERIDONE 2 MG PO TABS: 2.0000 mg | ORAL_TABLET | Freq: Two times a day (BID) | ORAL | Status: DC

## 2012-11-12 MED ORDER — HYDROXYZINE HCL 50 MG PO TABS: 50.0000 mg | ORAL_TABLET | Freq: Every evening | ORAL | Status: DC | PRN

## 2012-11-12 MED ORDER — TRAZODONE HCL 100 MG PO TABS: 100.0000 mg | ORAL_TABLET | Freq: Every day | ORAL | Status: DC

## 2012-11-12 MED ORDER — OXCARBAZEPINE 150 MG PO TABS: 150.0000 mg | ORAL_TABLET | Freq: Two times a day (BID) | ORAL | Status: DC

## 2012-11-12 NOTE — Progress Notes (Signed)
Patient ID: Tyler Dominguez, male   DOB: 1980-11-28, 32 y.o.   MRN: [id number] Patient to be discharged home today.  All personal belongings to be returned to patient.  He will receive medication samples and prescriptions.  Reviewed discharge instruction with patient and explained his follow up.  Patient will follow up with [facility] and [facility].  He denies any SI/HI/AVH.

## 2012-11-12 NOTE — Progress Notes (Signed)
Reviewed and agreed with 
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
## 2012-11-12 NOTE — [facility] Suicide Risk Assessment (Signed)
Suicide Risk Assessment  Discharge Assessment     Demographic Factors:  Male, Low socioeconomic status and Unemployed  Mental Status Per Nursing Assessment::   On Admission:     Current Mental Status by Physician: patient deneis suicidal ideation, intent or plan  Loss Factors: Decrease in vocational status and Financial problems/change in socioeconomic status  Historical Factors: Family history of mental illness or substance abuse and Impulsivity  Risk Reduction Factors:   Sense of responsibility to family, Living with another person, especially a relative and Positive social support  Continued Clinical Symptoms:  Alcohol/Substance Abuse/Dependencies  Cognitive Features That Contribute To Risk:  Closed-mindedness Polarized thinking    Suicide Risk:  Minimal: No identifiable suicidal ideation.  Patients presenting with no risk factors but with morbid ruminations; may be classified as minimal risk based on the severity of the depressive symptoms  Discharge Diagnoses:   AXIS I:  Bipolar disorder unspecified               Cannabis use disorder  AXIS II:  Deferred AXIS III:   Past Medical History  Diagnosis Date  . Mood disorder neck injury    AXIS IV:  economic problems, other psychosocial or environmental problems and problems related to social environment AXIS V:  61-70 mild symptoms  Plan Of Care/Follow-up recommendations:  Activity:  as tolerated Diet:  healthy Tests:  routine Other:  patient to keep his after care appointment  Is patient on multiple antipsychotic therapies at discharge:  No   Has Patient had three or more failed trials of antipsychotic monotherapy by history:  No  Recommended Plan for Multiple Antipsychotic Therapies: N/A  Thedore Mins, MD 11/12/2012, 9:21 AM

## 2012-11-13 NOTE — Discharge Summary (Signed)
Seen and agreed. Gordan Grell, MD 

## 2012-11-17 NOTE — Progress Notes (Signed)
Patient Discharge Instructions:  After Visit Summary (AVS):   Faxed to:  11/17/12 Discharge Summary Note:   Faxed to:  11/17/12 Psychiatric Admission Assessment Note:   Faxed to:  11/17/12 Suicide Risk Assessment - Discharge Assessment:   Faxed to:  11/17/12 Faxed/Sent to the Next Level Care provider:  11/17/12 Faxed to [facility] @ [phone]  Jerelene Redden, 11/17/2012, 4:20 PM

## 2013-06-08 ENCOUNTER — Emergency Department (HOSPITAL_COMMUNITY)
Admission: EM | Admit: 2013-06-08 | Discharge: 2013-06-09 | Disposition: A | Discharge status: Home / Self Care | Payer: Medicaid Other | Attending: Emergency Medicine | Admitting: Emergency Medicine

## 2013-06-08 ENCOUNTER — Encounter (HOSPITAL_COMMUNITY): Payer: Self-pay | Admitting: Emergency Medicine

## 2013-06-08 DIAGNOSIS — Z79899 Other long term (current) drug therapy: Secondary | ICD-10-CM | POA: Insufficient documentation

## 2013-06-08 DIAGNOSIS — IMO0002 Reserved for concepts with insufficient information to code with codable children: Secondary | ICD-10-CM | POA: Insufficient documentation

## 2013-06-08 DIAGNOSIS — Z87828 Personal history of other (healed) physical injury and trauma: Secondary | ICD-10-CM | POA: Insufficient documentation

## 2013-06-08 DIAGNOSIS — F17200 Nicotine dependence, unspecified, uncomplicated: Secondary | ICD-10-CM | POA: Insufficient documentation

## 2013-06-08 DIAGNOSIS — F1910 Other psychoactive substance abuse, uncomplicated: Secondary | ICD-10-CM | POA: Diagnosis present

## 2013-06-08 DIAGNOSIS — F39 Unspecified mood [affective] disorder: Secondary | ICD-10-CM | POA: Insufficient documentation

## 2013-06-08 DIAGNOSIS — F911 Conduct disorder, childhood-onset type: Secondary | ICD-10-CM | POA: Insufficient documentation

## 2013-06-08 DIAGNOSIS — F1210 Cannabis abuse, uncomplicated: Secondary | ICD-10-CM | POA: Insufficient documentation

## 2013-06-08 DIAGNOSIS — R4689 Other symptoms and signs involving appearance and behavior: Secondary | ICD-10-CM

## 2013-06-08 LAB — COMPREHENSIVE METABOLIC PANEL
ALBUMIN: 2.8 g/dL — AB (ref 3.5–5.2)
ALT: 18 U/L (ref 0–53)
AST: 17 U/L (ref 0–37)
Alkaline Phosphatase: 80 U/L (ref 39–117)
BUN: 11 mg/dL (ref 6–23)
CALCIUM: 9.5 mg/dL (ref 8.4–10.5)
CO2: 29 mEq/L (ref 19–32)
Chloride: 100 mEq/L (ref 96–112)
Creatinine, Ser: 0.63 mg/dL (ref 0.50–1.35)
GFR calc non Af Amer: >90 mL/min (ref >90)
GLUCOSE: 109 mg/dL — AB (ref 70–99)
Potassium: 4.5 mEq/L (ref 3.7–5.3)
SODIUM: 140 meq/L (ref 137–147)
TOTAL PROTEIN: 7.4 g/dL (ref 6.0–8.3)
Total Bilirubin: <0.2 mg/dL — ABNORMAL LOW (ref 0.3–1.2)

## 2013-06-08 LAB — RAPID URINE DRUG SCREEN, HOSP PERFORMED
Amphetamines: NOT DETECTED
BARBITURATES: NOT DETECTED
BENZODIAZEPINES: NOT DETECTED
COCAINE: NOT DETECTED
Opiates: NOT DETECTED
Tetrahydrocannabinol: POSITIVE — AB

## 2013-06-08 LAB — CBC
HCT: 33.4 % — ABNORMAL LOW (ref 39.0–52.0)
HEMOGLOBIN: 10.5 g/dL — AB (ref 13.0–17.0)
MCH: 22.9 pg — AB (ref 26.0–34.0)
MCHC: 31.4 g/dL (ref 30.0–36.0)
MCV: 72.8 fL — ABNORMAL LOW (ref 78.0–100.0)
PLATELETS: 489 10*3/uL — AB (ref 150–400)
RBC: 4.59 MIL/uL (ref 4.22–5.81)
RDW: 16.7 % — AB (ref 11.5–15.5)
WBC: 5.5 10*3/uL (ref 4.0–10.5)

## 2013-06-08 LAB — ETHANOL: Alcohol, Ethyl (B): <11 mg/dL (ref 0–11)

## 2013-06-08 MED ORDER — ALUM & MAG HYDROXIDE-SIMETH 200-200-20 MG/5ML PO SUSP: 30.0000 mL | ORAL | Status: DC | PRN

## 2013-06-08 MED ORDER — OXCARBAZEPINE 150 MG PO TABS
150.0000 mg | ORAL_TABLET | Freq: Two times a day (BID) | ORAL | Status: DC
  Filled 2013-06-08: qty 1

## 2013-06-08 MED ORDER — RISPERIDONE 2 MG PO TABS
2.0000 mg | ORAL_TABLET | Freq: Two times a day (BID) | ORAL | Status: DC
  Filled 2013-06-08: qty 1

## 2013-06-08 MED ORDER — OXCARBAZEPINE 150 MG PO TABS
150.0000 mg | ORAL_TABLET | Freq: Two times a day (BID) | ORAL | Status: DC
  Filled 2013-06-08 (×3): qty 1

## 2013-06-08 MED ORDER — ONDANSETRON HCL 4 MG PO TABS: 4.0000 mg | ORAL_TABLET | Freq: Three times a day (TID) | ORAL | Status: DC | PRN

## 2013-06-08 MED ORDER — HYDROXYZINE HCL 25 MG PO TABS: 50.0000 mg | ORAL_TABLET | Freq: Every evening | ORAL | Status: DC | PRN

## 2013-06-08 MED ORDER — ZIPRASIDONE MESYLATE 20 MG IM SOLR
20.0000 mg | Freq: Two times a day (BID) | INTRAMUSCULAR | Status: DC | PRN
  Filled 2013-06-08: qty 20

## 2013-06-08 MED ORDER — LORAZEPAM 1 MG PO TABS: 1.0000 mg | ORAL_TABLET | Freq: Three times a day (TID) | ORAL | Status: DC | PRN

## 2013-06-08 MED ORDER — TRAZODONE HCL 100 MG PO TABS: 100.0000 mg | ORAL_TABLET | Freq: Every day | ORAL | Status: DC

## 2013-06-08 MED ORDER — RISPERIDONE 2 MG PO TABS: 2.0000 mg | ORAL_TABLET | Freq: Two times a day (BID) | ORAL | Status: DC

## 2013-06-08 MED ORDER — TRAZODONE HCL 100 MG PO TABS
100.0000 mg | ORAL_TABLET | Freq: Every day | ORAL | Status: DC
  Filled 2013-06-08: qty 1

## 2013-06-08 MED ORDER — NICOTINE 21 MG/24HR TD PT24: 21.0000 mg | MEDICATED_PATCH | Freq: Every day | TRANSDERMAL | Status: DC

## 2013-06-08 NOTE — ED Notes (Signed)
Pt addiment about leaving at 600pm. States he has to get some food and go to his court date tomorrow. It was explained he  Would get a meal tray here. He became agitated and loud and said I dont want none of your food. I want to leave.
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
## 2013-06-08 NOTE — ED Notes (Signed)
Rashell RN attempted therapeutic discussion with patient in hallway. Patient viewed a copy of his IVC paperwork. Stomped off to his room. In bed with head covered.  Patient safety maintained, Q 15 checks continue.

## 2013-06-08 NOTE — ED Notes (Signed)
Pt remembered the staff here and brought up an old memory that was accurate. Pt states he has a court date tomorrow and needs to be out of here at 6.

## 2013-06-08 NOTE — ED Notes (Signed)
MD at bedside. 

## 2013-06-08 NOTE — ED Notes (Addendum)
Pt is currently cooperative.  Pt seems manic and is having flight of ideas.  Pt denies SI, HI, and hallucinations.
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
## 2013-06-08 NOTE — ED Notes (Signed)
Patient refused to take medication he states that " that [employer] broad, mom dukes, and police are in cahoots with each other"  I take my own medications when I get home".

## 2013-06-08 NOTE — ED Notes (Addendum)
Patient anxiety increasing. Continues to pace the hallway and knock at TTS door. Patient informed of IVC status. Encouragement offered. TTS and Probation officer have spoken with patient multiple times. Patient refusing food, will not stay in his room. Continues to insist that he needs to get his belongings and leave.  Patient safety maintained, Q 15 checks continue.
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
## 2013-06-08 NOTE — ED Notes (Signed)
Patient is defensive. Agitated. Denies SI, HI, AVH. States that he is ready to sign his stuff out and leave. States that he has an early appointment in the morning. Was on the phone with his mother and grandmother, appeared agitated.  Spoke with patient's mother. She states that patient has court in the morning. She is concerned about him missing the date.   Patient from 33 entered patient's room and sat in the floor. Patient remained calm as other patient was escorted out.   Patient safety maintained, Q 15 checks continue.

## 2013-06-08 NOTE — Progress Notes (Signed)
CSW informed the patient that it was recommended for him to stay in the ED and be seen in the morning by a psychiatrist.  The patient refused to stay in the hospital becoming agitated.  Patient presented with very tensed posture as evidenced by clenched fist, frown faced, and belligerent comments.  Patient is clearly focused his attention on the CSW as evidenced by him continuous knocking on the counseling office door requesting to be discharged.  "Im ready to get the (F) out of her so open the doors."  Nursing staff assisted with calming the patient.     Chesley Noon, MSW, [place], 06/08/2013 Evening Clinical Social Worker [phone]

## 2013-06-09 ENCOUNTER — Encounter (HOSPITAL_COMMUNITY): Payer: Self-pay | Admitting: Registered Nurse

## 2013-06-09 DIAGNOSIS — F1910 Other psychoactive substance abuse, uncomplicated: Secondary | ICD-10-CM | POA: Diagnosis present

## 2013-06-09 DIAGNOSIS — R4689 Other symptoms and signs involving appearance and behavior: Secondary | ICD-10-CM | POA: Diagnosis present

## 2013-06-09 DIAGNOSIS — F1994 Other psychoactive substance use, unspecified with psychoactive substance-induced mood disorder: Secondary | ICD-10-CM

## 2013-06-09 MED ORDER — RISPERIDONE 2 MG PO TABS: 2.0000 mg | ORAL_TABLET | Freq: Two times a day (BID) | ORAL | Status: DC

## 2013-06-09 NOTE — Progress Notes (Addendum)
Per discussion with psychiatrist, patient psychiatrically stable for discharge home. CSW provided patient with outpatient resources. Pt mother refused to transport patient home, and stated he needs to go home by bus.   Belia Heman, [place]  ED CSW 06/09/2013

## 2013-06-09 NOTE — ED Notes (Signed)
Patient alert. In hallway, paces slowly, then leans against wall quietly starring into nurses' station. Does not respond when addressed.  Patient safety maintained, Q 15 checks continue.

## 2013-06-09 NOTE — Progress Notes (Signed)
Pt requested csw to speak with pt mother to obtain probation officer contact information. CSW spoke with Leonarda Salon at [phone] who states the Probation Officer already knows, and that the contact information was in the car. Pt mother states she was on her way to the hospital and can bring the contact info. CSW attempted to reach probation officer Sher[place]. CSW left message with Probation Parole office at [phone]. Pt signed consent to release information in patient chart.   Noreene Larsson [phone]  ED CSW 06/09/2013 857am

## 2013-06-09 NOTE — Discharge Instructions (Signed)
Aggression Physically aggressive behavior is common among small children. When frustrated or angry, toddlers may act out. Often, they will push, bite, or hit. Most children show less physical aggression as they grow up. Their language and interpersonal skills improve, too. But continued aggressive behavior is a sign of a problem. This behavior can lead to aggression and delinquency in adolescence and adulthood. Aggressive behavior can be psychological or physical. Forms of psychological aggression include threatening or bullying others. Forms of physical aggression include:  Pushing.  Hitting.  Slapping.  Kicking.  Stabbing.  Shooting.  Raping. PREVENTION  Encouraging the following behaviors can help manage aggression:  Respecting others and valuing differences.  Participating in school and community functions, including sports, music, after-school programs, community groups, and volunteer work.  Talking with an adult when they are sad, depressed, fearful, anxious, or angry. Discussions with a parent or other family member, Social worker, Pharmacist, hospital, or coach can help.  Avoiding alcohol and drug use.  Dealing with disagreements without aggression, such as conflict resolution. To learn this, children need parents and caregivers to model respectful communication and problem solving.  Limiting exposure to aggression and violence, such as video games that are not age appropriate, violence in the media, or domestic violence. Document Released: 12/30/2006 Document Revised: 05/27/2011 Document Reviewed: 05/10/2010 [facility] Patient Information 2014 [place], [place].
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
## 2013-06-09 NOTE — ED Notes (Signed)
Patient awake. Pacing hallway, appears anxious, upset. Through small amount of water from his cup into the hallway. Denies complaint. Offered ordered medications. States "I don't take no F**king medications". Continues to slowly pace hallway.   Patient safety maintained, Q 15 checks continue.

## 2013-06-09 NOTE — ED Notes (Signed)
I asked patient if i could get his vitals, he said no and i informed the nurse
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
## 2014-05-17 ENCOUNTER — Emergency Department (HOSPITAL_COMMUNITY)
Admission: EM | Admit: 2014-05-17 | Discharge: 2014-05-17 | Disposition: A | Discharge status: Home / Self Care | Payer: No Typology Code available for payment source | Attending: Emergency Medicine | Admitting: Emergency Medicine

## 2014-05-17 ENCOUNTER — Encounter (HOSPITAL_COMMUNITY): Payer: Self-pay | Admitting: *Deleted

## 2014-05-17 DIAGNOSIS — Z87828 Personal history of other (healed) physical injury and trauma: Secondary | ICD-10-CM | POA: Insufficient documentation

## 2014-05-17 DIAGNOSIS — Z720 Tobacco use: Secondary | ICD-10-CM | POA: Insufficient documentation

## 2014-05-17 DIAGNOSIS — Z79899 Other long term (current) drug therapy: Secondary | ICD-10-CM | POA: Diagnosis not present

## 2014-05-17 DIAGNOSIS — Z8659 Personal history of other mental and behavioral disorders: Secondary | ICD-10-CM | POA: Diagnosis not present

## 2014-05-17 DIAGNOSIS — Z0000 Encounter for general adult medical examination without abnormal findings: Secondary | ICD-10-CM | POA: Diagnosis not present

## 2014-05-17 DIAGNOSIS — Z139 Encounter for screening, unspecified: Secondary | ICD-10-CM
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
## 2014-05-17 NOTE — ED Notes (Signed)
PA at bedside.

## 2014-05-17 NOTE — ED Notes (Signed)
Pt undressed, in gown, on continuous pulse oximetry and blood pressure cuff 

## 2014-05-17 NOTE — Discharge Instructions (Signed)
May take tylenol or motrin as needed should you begin experiencing neck pain again. Follow-up with your primary care physician. Return to the ED for new or worsening symptoms.

## 2014-09-23 DIAGNOSIS — R112 Nausea with vomiting, unspecified: Secondary | ICD-10-CM | POA: Diagnosis present

## 2014-09-23 DIAGNOSIS — Z720 Tobacco use: Secondary | ICD-10-CM | POA: Diagnosis not present

## 2014-09-23 DIAGNOSIS — Z87828 Personal history of other (healed) physical injury and trauma: Secondary | ICD-10-CM | POA: Insufficient documentation

## 2014-09-23 DIAGNOSIS — R109 Unspecified abdominal pain: Secondary | ICD-10-CM | POA: Insufficient documentation

## 2014-09-24 ENCOUNTER — Emergency Department (HOSPITAL_COMMUNITY)
Admission: EM | Admit: 2014-09-24 | Discharge: 2014-09-24 | Disposition: A | Discharge status: Home / Self Care | Payer: Medicaid Other | Attending: Emergency Medicine | Admitting: Emergency Medicine

## 2014-09-24 DIAGNOSIS — R112 Nausea with vomiting, unspecified: Secondary | ICD-10-CM

## 2014-09-24 LAB — CBC WITH DIFFERENTIAL/PLATELET
Basophils Absolute: 0 10*3/uL (ref 0.0–0.1)
Basophils Relative: 0 % (ref 0–1)
Eosinophils Absolute: 0 10*3/uL (ref 0.0–0.7)
Eosinophils Relative: 0 % (ref 0–5)
HCT: 36.2 % — ABNORMAL LOW (ref 39.0–52.0)
Hemoglobin: 11.4 g/dL — ABNORMAL LOW (ref 13.0–17.0)
Lymphocytes Relative: 6 % — ABNORMAL LOW (ref 12–46)
Lymphs Abs: 0.6 10*3/uL — ABNORMAL LOW (ref 0.7–4.0)
MCH: 22.8 pg — ABNORMAL LOW (ref 26.0–34.0)
MCHC: 31.5 g/dL (ref 30.0–36.0)
MCV: 72.5 fL — ABNORMAL LOW (ref 78.0–100.0)
Monocytes Absolute: 0.4 10*3/uL (ref 0.1–1.0)
Monocytes Relative: 4 % (ref 3–12)
Neutro Abs: 9 10*3/uL — ABNORMAL HIGH (ref 1.7–7.7)
Neutrophils Relative %: 90 % — ABNORMAL HIGH (ref 43–77)
Platelets: 401 10*3/uL — ABNORMAL HIGH (ref 150–400)
RBC: 4.99 MIL/uL (ref 4.22–5.81)
RDW: 19.4 % — ABNORMAL HIGH (ref 11.5–15.5)
WBC: 10.1 10*3/uL (ref 4.0–10.5)

## 2014-09-24 LAB — COMPREHENSIVE METABOLIC PANEL
ALT: 11 U/L — ABNORMAL LOW (ref 17–63)
AST: 16 U/L (ref 15–41)
Albumin: 3.5 g/dL (ref 3.5–5.0)
Alkaline Phosphatase: 95 U/L (ref 38–126)
Anion gap: 14 (ref 5–15)
BUN: 9 mg/dL (ref 6–20)
CO2: 27 mmol/L (ref 22–32)
Calcium: 9.4 mg/dL (ref 8.9–10.3)
Chloride: 100 mmol/L — ABNORMAL LOW (ref 101–111)
Creatinine, Ser: 0.7 mg/dL (ref 0.61–1.24)
GFR calc Af Amer: >60 mL/min (ref >60)
GFR calc non Af Amer: >60 mL/min (ref >60)
Glucose, Bld: 146 mg/dL — ABNORMAL HIGH (ref 65–99)
Potassium: 3.7 mmol/L (ref 3.5–5.1)
Sodium: 141 mmol/L (ref 135–145)
Total Bilirubin: 0.6 mg/dL (ref 0.3–1.2)
Total Protein: 8.1 g/dL (ref 6.5–8.1)

## 2014-09-24 LAB — LIPASE, BLOOD: Lipase: 23 U/L (ref 22–51)

## 2014-09-24 MED ORDER — ONDANSETRON 8 MG PO TBDP: 8.0000 mg | ORAL_TABLET | Freq: Three times a day (TID) | ORAL | Status: DC | PRN

## 2014-09-24 MED ORDER — ONDANSETRON 4 MG PO TBDP
8.0000 mg | ORAL_TABLET | Freq: Once | ORAL | Status: AC
  Administered 2014-09-24: 8 mg via ORAL
  Filled 2014-09-24: qty 2

## 2014-09-24 NOTE — Discharge Instructions (Signed)
We saw you in the ER for the vomiting. All the results in the ER are normal. You passed our oral challenge. We are not sure what is causing your symptoms. The workup in the ER is not complete, and is limited to screening for life threatening and emergent conditions only, so please see a primary care doctor for further evaluation.   Nausea and Vomiting Nausea is a sick feeling that often comes before throwing up (vomiting). Vomiting is a reflex where stomach contents come out of your mouth. Vomiting can cause severe loss of body fluids (dehydration). Children and elderly adults can become dehydrated quickly, especially if they also have diarrhea. Nausea and vomiting are symptoms of a condition or disease. It is important to find the cause of your symptoms. CAUSES   Direct irritation of the stomach lining. This irritation can result from increased acid production (gastroesophageal reflux disease), infection, food poisoning, taking certain medicines (such as nonsteroidal anti-inflammatory drugs), alcohol use, or tobacco use.  Signals from the brain.These signals could be caused by a headache, heat exposure, an inner ear disturbance, increased pressure in the brain from injury, infection, a tumor, or a concussion, pain, emotional stimulus, or metabolic problems.  An obstruction in the gastrointestinal tract (bowel obstruction).  Illnesses such as diabetes, hepatitis, gallbladder problems, appendicitis, kidney problems, cancer, sepsis, atypical symptoms of a heart attack, or eating disorders.  Medical treatments such as chemotherapy and radiation.  Receiving medicine that makes you sleep (general anesthetic) during surgery. DIAGNOSIS Your caregiver may ask for tests to be done if the problems do not improve after a few days. Tests may also be done if symptoms are severe or if the reason for the nausea and vomiting is not clear. Tests may include:  Urine tests.  Blood tests.  Stool  tests.  Cultures (to look for evidence of infection).  X-rays or other imaging studies. Test results can help your caregiver make decisions about treatment or the need for additional tests. TREATMENT You need to stay well hydrated. Drink frequently but in small amounts.You may wish to drink water, sports drinks, clear broth, or eat frozen ice pops or gelatin dessert to help stay hydrated.When you eat, eating slowly may help prevent nausea.There are also some antinausea medicines that may help prevent nausea. HOME CARE INSTRUCTIONS   Take all medicine as directed by your caregiver.  If you do not have an appetite, do not force yourself to eat. However, you must continue to drink fluids.  If you have an appetite, eat a normal diet unless your caregiver tells you differently.  Eat a variety of complex carbohydrates (rice, wheat, potatoes, bread), lean meats, yogurt, fruits, and vegetables.  Avoid high-fat foods because they are more difficult to digest.  Drink enough water and fluids to keep your urine clear or pale yellow.  If you are dehydrated, ask your caregiver for specific rehydration instructions. Signs of dehydration may include:  Severe thirst.  Dry lips and mouth.  Dizziness.  Dark urine.  Decreasing urine frequency and amount.  Confusion.  Rapid breathing or pulse. SEEK IMMEDIATE MEDICAL CARE IF:   You have blood or brown flecks (like coffee grounds) in your vomit.  You have black or bloody stools.  You have a severe headache or stiff neck.  You are confused.  You have severe abdominal pain.  You have chest pain or trouble breathing.  You do not urinate at least once every 8 hours.  You develop cold or clammy skin.  You  continue to vomit for longer than 24 to 48 hours.  You have a fever. MAKE SURE YOU:   Understand these instructions.  Will watch your condition.  Will get help right away if you are not doing well or get worse. Document  Released: 03/04/2005 Document Revised: 05/27/2011 Document Reviewed: 08/01/2010 [facility] Patient Information 2015 [place], [place]. This information is not intended to replace advice given to you by your health care provider. Make sure you discuss any questions you have with your health care provider.

## 2014-09-24 NOTE — ED Notes (Signed)
MD at bedside. 

## 2014-09-24 NOTE — ED Notes (Signed)
Gave pt second PO challenge

## 2014-09-24 NOTE — ED Notes (Signed)
Patient here with vomiting all day. Mother states that he has been vomiting since 1500 today. States that he has been eating today but he keeps getting sick. States that emesis appears clear. Patient with mixed answers in triage.
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
## 2014-09-24 NOTE — ED Notes (Signed)
Gave pt zofran pills at 0300, pt didn't take until 0430.

## 2014-09-24 NOTE — ED Notes (Signed)
Pt feels nausea is under control and hasn't vomited in two hours. Pt states he feels well enough to go home.

## 2014-09-24 NOTE — ED Notes (Signed)
Pt projectile vomiting all over ED room, blood visible in vomit. MD notified. Plan to observe post zofran.

## 2015-01-28 ENCOUNTER — Emergency Department (HOSPITAL_COMMUNITY)
Admission: EM | Admit: 2015-01-28 | Discharge: 2015-01-28 | Disposition: A | Discharge status: Home / Self Care | Payer: Medicaid Other | Attending: Emergency Medicine | Admitting: Emergency Medicine

## 2015-01-28 ENCOUNTER — Encounter (HOSPITAL_COMMUNITY): Payer: Self-pay | Admitting: Nurse Practitioner

## 2015-01-28 DIAGNOSIS — Z87820 Personal history of traumatic brain injury: Secondary | ICD-10-CM | POA: Diagnosis not present

## 2015-01-28 DIAGNOSIS — Z8659 Personal history of other mental and behavioral disorders: Secondary | ICD-10-CM | POA: Insufficient documentation

## 2015-01-28 DIAGNOSIS — Z720 Tobacco use: Secondary | ICD-10-CM | POA: Insufficient documentation

## 2015-01-28 DIAGNOSIS — R112 Nausea with vomiting, unspecified: Secondary | ICD-10-CM | POA: Diagnosis not present

## 2015-01-28 DIAGNOSIS — R197 Diarrhea, unspecified: Secondary | ICD-10-CM | POA: Insufficient documentation

## 2015-01-28 LAB — COMPREHENSIVE METABOLIC PANEL
ALBUMIN: 3.3 g/dL — AB (ref 3.5–5.0)
ALK PHOS: 84 U/L (ref 38–126)
ALT: 13 U/L — ABNORMAL LOW (ref 17–63)
ANION GAP: 9 (ref 5–15)
AST: 14 U/L — ABNORMAL LOW (ref 15–41)
BILIRUBIN TOTAL: 0.3 mg/dL (ref 0.3–1.2)
BUN: 13 mg/dL (ref 6–20)
CALCIUM: 9.6 mg/dL (ref 8.9–10.3)
CO2: 28 mmol/L (ref 22–32)
CREATININE: 0.71 mg/dL (ref 0.61–1.24)
Chloride: 101 mmol/L (ref 101–111)
GFR calc Af Amer: >60 mL/min (ref >60)
GFR calc non Af Amer: >60 mL/min (ref >60)
GLUCOSE: 122 mg/dL — AB (ref 65–99)
Potassium: 4.6 mmol/L (ref 3.5–5.1)
Sodium: 138 mmol/L (ref 135–145)
TOTAL PROTEIN: 8.3 g/dL — AB (ref 6.5–8.1)

## 2015-01-28 LAB — CBC
HEMATOCRIT: 38.9 % — AB (ref 39.0–52.0)
Hemoglobin: 12.4 g/dL — ABNORMAL LOW (ref 13.0–17.0)
MCH: 23.9 pg — ABNORMAL LOW (ref 26.0–34.0)
MCHC: 31.9 g/dL (ref 30.0–36.0)
MCV: 75 fL — ABNORMAL LOW (ref 78.0–100.0)
PLATELETS: 455 10*3/uL — AB (ref 150–400)
RBC: 5.19 MIL/uL (ref 4.22–5.81)
RDW: 16.9 % — AB (ref 11.5–15.5)
WBC: 11.2 10*3/uL — ABNORMAL HIGH (ref 4.0–10.5)

## 2015-01-28 LAB — LIPASE, BLOOD: LIPASE: 29 U/L (ref 11–51)

## 2015-01-28 MED ORDER — ONDANSETRON 4 MG PO TBDP
8.0000 mg | ORAL_TABLET | Freq: Once | ORAL | Status: AC
  Administered 2015-01-28: 8 mg via ORAL
  Filled 2015-01-28: qty 2

## 2015-01-28 MED ORDER — ONDANSETRON 8 MG PO TBDP: 8.0000 mg | ORAL_TABLET | Freq: Three times a day (TID) | ORAL | Status: DC | PRN

## 2015-01-28 NOTE — ED Notes (Signed)
MD at bedside. 

## 2015-01-28 NOTE — ED Notes (Signed)
Pt given urinal for sample 
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
## 2015-01-28 NOTE — Discharge Instructions (Signed)
See the primary care doctor if the poor appetite continues and weight loss continues.  Nausea and Vomiting Nausea is a sick feeling that often comes before throwing up (vomiting). Vomiting is a reflex where stomach contents come out of your mouth. Vomiting can cause severe loss of body fluids (dehydration). Children and elderly adults can become dehydrated quickly, especially if they also have diarrhea. Nausea and vomiting are symptoms of a condition or disease. It is important to find the cause of your symptoms. CAUSES   Direct irritation of the stomach lining. This irritation can result from increased acid production (gastroesophageal reflux disease), infection, food poisoning, taking certain medicines (such as nonsteroidal anti-inflammatory drugs), alcohol use, or tobacco use.  Signals from the brain.These signals could be caused by a headache, heat exposure, an inner ear disturbance, increased pressure in the brain from injury, infection, a tumor, or a concussion, pain, emotional stimulus, or metabolic problems.  An obstruction in the gastrointestinal tract (bowel obstruction).  Illnesses such as diabetes, hepatitis, gallbladder problems, appendicitis, kidney problems, cancer, sepsis, atypical symptoms of a heart attack, or eating disorders.  Medical treatments such as chemotherapy and radiation.  Receiving medicine that makes you sleep (general anesthetic) during surgery. DIAGNOSIS Your caregiver may ask for tests to be done if the problems do not improve after a few days. Tests may also be done if symptoms are severe or if the reason for the nausea and vomiting is not clear. Tests may include:  Urine tests.  Blood tests.  Stool tests.  Cultures (to look for evidence of infection).  X-rays or other imaging studies. Test results can help your caregiver make decisions about treatment or the need for additional tests. TREATMENT You need to stay well hydrated. Drink frequently but in  small amounts.You may wish to drink water, sports drinks, clear broth, or eat frozen ice pops or gelatin dessert to help stay hydrated.When you eat, eating slowly may help prevent nausea.There are also some antinausea medicines that may help prevent nausea. HOME CARE INSTRUCTIONS   Take all medicine as directed by your caregiver.  If you do not have an appetite, do not force yourself to eat. However, you must continue to drink fluids.  If you have an appetite, eat a normal diet unless your caregiver tells you differently.  Eat a variety of complex carbohydrates (rice, wheat, potatoes, bread), lean meats, yogurt, fruits, and vegetables.  Avoid high-fat foods because they are more difficult to digest.  Drink enough water and fluids to keep your urine clear or pale yellow.  If you are dehydrated, ask your caregiver for specific rehydration instructions. Signs of dehydration may include:  Severe thirst.  Dry lips and mouth.  Dizziness.  Dark urine.  Decreasing urine frequency and amount.  Confusion.  Rapid breathing or pulse. SEEK IMMEDIATE MEDICAL CARE IF:   You have blood or brown flecks (like coffee grounds) in your vomit.  You have black or bloody stools.  You have a severe headache or stiff neck.  You are confused.  You have severe abdominal pain.  You have chest pain or trouble breathing.  You do not urinate at least once every 8 hours.  You develop cold or clammy skin.  You continue to vomit for longer than 24 to 48 hours.  You have a fever. MAKE SURE YOU:   Understand these instructions.  Will watch your condition.  Will get help right away if you are not doing well or get worse.   This information is  not intended to replace advice given to you by your health care provider. Make sure you discuss any questions you have with your health care provider.   Document Released: 03/04/2005 Document Revised: 05/27/2011 Document Reviewed: 08/01/2010 Elsevier  Interactive Patient Education [employer].
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
## 2015-10-10 ENCOUNTER — Emergency Department (HOSPITAL_COMMUNITY)
Admission: EM | Admit: 2015-10-10 | Discharge: 2015-10-13 | Disposition: A | Discharge status: Home / Self Care | Payer: Medicaid Other | Attending: Emergency Medicine | Admitting: Emergency Medicine

## 2015-10-10 ENCOUNTER — Encounter (HOSPITAL_COMMUNITY): Payer: Self-pay | Admitting: Emergency Medicine

## 2015-10-10 DIAGNOSIS — F918 Other conduct disorders: Secondary | ICD-10-CM | POA: Insufficient documentation

## 2015-10-10 DIAGNOSIS — F6381 Intermittent explosive disorder: Secondary | ICD-10-CM | POA: Diagnosis not present

## 2015-10-10 DIAGNOSIS — F6089 Other specific personality disorders: Secondary | ICD-10-CM | POA: Diagnosis not present

## 2015-10-10 DIAGNOSIS — F17210 Nicotine dependence, cigarettes, uncomplicated: Secondary | ICD-10-CM | POA: Diagnosis not present

## 2015-10-10 DIAGNOSIS — Z79899 Other long term (current) drug therapy: Secondary | ICD-10-CM | POA: Insufficient documentation

## 2015-10-10 DIAGNOSIS — Z046 Encounter for general psychiatric examination, requested by authority: Secondary | ICD-10-CM | POA: Diagnosis present

## 2015-10-10 DIAGNOSIS — F319 Bipolar disorder, unspecified: Secondary | ICD-10-CM | POA: Diagnosis not present

## 2015-10-10 DIAGNOSIS — R4689 Other symptoms and signs involving appearance and behavior: Secondary | ICD-10-CM

## 2015-10-10 DIAGNOSIS — F329 Major depressive disorder, single episode, unspecified: Secondary | ICD-10-CM | POA: Diagnosis not present

## 2015-10-10 LAB — COMPREHENSIVE METABOLIC PANEL
ALK PHOS: 80 U/L (ref 38–126)
ALT: 10 U/L — AB (ref 17–63)
ANION GAP: 6 (ref 5–15)
AST: 14 U/L — ABNORMAL LOW (ref 15–41)
Albumin: 3.6 g/dL (ref 3.5–5.0)
BILIRUBIN TOTAL: 0.4 mg/dL (ref 0.3–1.2)
BUN: 15 mg/dL (ref 6–20)
CALCIUM: 9.1 mg/dL (ref 8.9–10.3)
CO2: 29 mmol/L (ref 22–32)
CREATININE: 0.63 mg/dL (ref 0.61–1.24)
Chloride: 104 mmol/L (ref 101–111)
GFR calc non Af Amer: >60 mL/min (ref >60)
Glucose, Bld: 82 mg/dL (ref 65–99)
Potassium: 4.1 mmol/L (ref 3.5–5.1)
Sodium: 139 mmol/L (ref 135–145)
TOTAL PROTEIN: 8.1 g/dL (ref 6.5–8.1)

## 2015-10-10 LAB — CBC
HEMATOCRIT: 36.2 % — AB (ref 39.0–52.0)
HEMOGLOBIN: 11.3 g/dL — AB (ref 13.0–17.0)
MCH: 23.4 pg — AB (ref 26.0–34.0)
MCHC: 31.2 g/dL (ref 30.0–36.0)
MCV: 74.9 fL — ABNORMAL LOW (ref 78.0–100.0)
Platelets: 542 10*3/uL — ABNORMAL HIGH (ref 150–400)
RBC: 4.83 MIL/uL (ref 4.22–5.81)
RDW: 17.4 % — AB (ref 11.5–15.5)
WBC: 6.4 10*3/uL (ref 4.0–10.5)

## 2015-10-10 LAB — ACETAMINOPHEN LEVEL

## 2015-10-10 LAB — SALICYLATE LEVEL

## 2015-10-10 LAB — ETHANOL: Alcohol, Ethyl (B): <5 mg/dL (ref <5)

## 2015-10-10 MED ORDER — ZIPRASIDONE MESYLATE 20 MG IM SOLR
INTRAMUSCULAR | Status: AC
  Administered 2015-10-10: 20 mg via INTRAMUSCULAR
  Filled 2015-10-10: qty 20

## 2015-10-10 MED ORDER — NICOTINE 21 MG/24HR TD PT24
21.0000 mg | MEDICATED_PATCH | Freq: Every day | TRANSDERMAL | Status: DC
  Filled 2015-10-10 (×2): qty 1

## 2015-10-10 MED ORDER — ZIPRASIDONE MESYLATE 20 MG IM SOLR
20.0000 mg | Freq: Once | INTRAMUSCULAR | Status: AC
  Administered 2015-10-10: 20 mg via INTRAMUSCULAR

## 2015-10-10 MED ORDER — ONDANSETRON 8 MG PO TBDP: 8.0000 mg | ORAL_TABLET | Freq: Three times a day (TID) | ORAL | Status: DC | PRN

## 2015-10-10 MED ORDER — STERILE WATER FOR INJECTION IJ SOLN
INTRAMUSCULAR | Status: AC
  Filled 2015-10-10: qty 10
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
## 2015-10-10 NOTE — ED Notes (Signed)
Patient denies SI, HI and AVH at this time. Patient states " I'm leaving at 9:00 pm. Writer attempted to explain plan of care but patient is irritable at this time and states " I don't want to hear". Patient gets in bed and cover his head. Encouragement and support provided and safety maintain. Q 15 min safety checks in place and video monitoring.

## 2015-10-10 NOTE — ED Notes (Signed)
Patient is resting comfortably. 

## 2015-10-10 NOTE — ED Triage Notes (Signed)
Pt BIB mom.  Pt has been having fits of anger.  Pt has destroyed 2 TVs that his mother just bought.  Pt denies SI/HI but cannot control his anger.  Aggressive behavior during triage.

## 2015-10-11 DIAGNOSIS — F319 Bipolar disorder, unspecified: Secondary | ICD-10-CM | POA: Diagnosis not present

## 2015-10-11 DIAGNOSIS — F6381 Intermittent explosive disorder: Secondary | ICD-10-CM | POA: Diagnosis present

## 2015-10-11 MED ORDER — CARBAMAZEPINE 200 MG PO TABS
200.0000 mg | ORAL_TABLET | Freq: Two times a day (BID) | ORAL | Status: DC
  Administered 2015-10-11 – 2015-10-13 (×3): 200 mg via ORAL
  Filled 2015-10-11 (×6): qty 1

## 2015-10-11 MED ORDER — TRAZODONE HCL 50 MG PO TABS
50.0000 mg | ORAL_TABLET | Freq: Every evening | ORAL | Status: DC | PRN
Start: 2015-10-11 — End: 2015-10-13
  Filled 2015-10-11: qty 1

## 2015-10-11 NOTE — BH Assessment (Signed)
[facility] Assessment Progress Note  Per Mojeed Akintayo,MD, this pt requires psychiatric hospitalization at this time.  The following facilities have been contacted to seek placement for this pt, with results as noted:  Beds available, information sent, decision pending:  [facility] [facility]   At capacity:  [place], [place] Triage Specialist [phone]

## 2015-10-11 NOTE — ED Notes (Signed)
Pt guarded on approach, irritable, blunted affect, Denies SI/HI. Denies AVH. Delusional,concrete thinking. Special checks q 15 mins in place for safety. Video monitoring in place.

## 2015-10-11 NOTE — ED Notes (Signed)
Patient is resting comfortably. 
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
## 2015-10-11 NOTE — ED Notes (Signed)
Patient refused vitals Rn Rashell made aware of refusal

## 2015-10-11 NOTE — ED Provider Notes (Signed)
TTS consultation is appreciated. Patient meets inpatient criteria, but no beds currently available at behavioral [facility]. TTS is working on appropriate psychiatric placement.   Delora Fuel, MD [id number]

## 2015-10-11 NOTE — ED Notes (Signed)
Pt up at nurses station reports the t.v. Is talking to him about his condition. "Ya'll cant hear it?" Encouragement and support provided. Will continue to monitor.

## 2015-10-11 NOTE — ED Notes (Signed)
Pt became verbally aggressive towards this nurse during medication pass, referred to this nurse as "bitch" after the pt took the pill pt spit at this nurse.

## 2015-10-11 NOTE — ED Notes (Signed)
Patient denies SI,HI and AVH at this time. Patient appears paranoid at this time. Patient states "I am leaving at 12:00". Writer explained plan of care to patient. Encouragement and support provided and safety maintain. Q 15 min safety checks remain in place and video monitoring.
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
## 2015-10-12 LAB — RAPID URINE DRUG SCREEN, HOSP PERFORMED
AMPHETAMINES: NOT DETECTED
BENZODIAZEPINES: NOT DETECTED
Barbiturates: NOT DETECTED
COCAINE: NOT DETECTED
OPIATES: NOT DETECTED
TETRAHYDROCANNABINOL: POSITIVE — AB

## 2015-10-12 MED ORDER — OLANZAPINE 5 MG PO TBDP
5.0000 mg | ORAL_TABLET | Freq: Once | ORAL | Status: AC
  Administered 2015-10-12: 5 mg via ORAL
  Filled 2015-10-12: qty 1

## 2015-10-12 NOTE — Progress Notes (Signed)
CSW completed [facility] referral and obtained Authorization number from [facility].  CSW faxed referral form to [facility].  Authorization number: [id number]  Willette Brace [id number] ED CSW 10/12/2015 10:59 PM

## 2015-10-12 NOTE — BH Assessment (Signed)
[facility] Assessment Progress Note  The following facilities have been contacted to seek placement for this pt, with results as noted:  Beds available, information sent, decision pending:  [facility]   At capacity:  [facility], [place] Triage Specialist [phone]
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
## 2015-10-12 NOTE — ED Notes (Signed)
Patient refused to have vital signs rechecked.

## 2015-10-12 NOTE — ED Notes (Signed)
Patient denies SI, HI and AVH at this time. Patient appears paranoid at this time. Encouragement and support provided and safety maintain. Q 15 min safety checks remain in place and video monitoring.

## 2015-10-12 NOTE — ED Notes (Signed)
Unable to do complete assessment patient resting quietly with eyes closed. Safety maintain and Q 15 min safety checks remain in place and video monitoring.

## 2015-10-12 NOTE — ED Notes (Signed)
Patient has been up and present on the unit.  His speech is soft and tangential.  He is disorganized.  He has been focused on leaving today, but refused medications this morning.  He did take it after the doctors made rounds.  He also provided a urine sample this morning and is positive for THC.

## 2015-10-13 DIAGNOSIS — F319 Bipolar disorder, unspecified: Secondary | ICD-10-CM | POA: Diagnosis not present

## 2015-10-13 DIAGNOSIS — F6089 Other specific personality disorders: Secondary | ICD-10-CM | POA: Diagnosis not present

## 2015-10-13 DIAGNOSIS — F6381 Intermittent explosive disorder: Secondary | ICD-10-CM

## 2015-10-13 MED ORDER — TRAZODONE HCL 50 MG PO TABS: 50.0000 mg | ORAL_TABLET | Freq: Every evening | ORAL | 0 refills | Status: DC | PRN

## 2015-10-13 MED ORDER — CARBAMAZEPINE 200 MG PO TABS: 200.0000 mg | ORAL_TABLET | Freq: Two times a day (BID) | ORAL | 0 refills | Status: DC

## 2015-10-13 NOTE — [facility] Suicide Risk Assessment (Signed)
Suicide Risk Assessment  Discharge Assessment   [facility] Discharge Suicide Risk Assessment   Principal Problem: Bipolar disorder, unspecified [facility]) Discharge Diagnoses:  Patient Active Problem List   Diagnosis Date Noted  . Intermittent explosive disorder [F63.81] 10/11/2015    Priority: High  . Bipolar disorder, unspecified ([place]) [F31.9] 11/07/2012    Priority: High  . Psychosis [F29] 11/07/2012    Priority: Low  . Aggressive behavior [F60.89] 06/09/2013  . Substance abuse [F19.10] 06/09/2013  . Cannabis abuse [F12.10] 11/10/2012  . Mood disorder ([place]) [F39] 10/21/2012    Total Time spent with patient: 30 minutes  Musculoskeletal: Strength & Muscle Tone: within normal limits Gait & Station: normal Patient leans: N/A  Psychiatric Specialty Exam: Physical Exam  Constitutional: He is oriented to person, place, and time. He appears well-developed and well-nourished.  HENT:  Head: Normocephalic.  Neck: Normal range of motion.  Respiratory: Effort normal.  Musculoskeletal: Normal range of motion.  Neurological: He is alert and oriented to person, place, and time.  Skin: Skin is warm and dry.  Psychiatric: He has a normal mood and affect. His speech is normal and behavior is normal. Judgment and thought content normal. Cognition and memory are normal.    Review of Systems  Constitutional: Negative.   HENT: Negative.   Eyes: Negative.   Respiratory: Negative.   Cardiovascular: Negative.   Gastrointestinal: Negative.   Genitourinary: Negative.   Musculoskeletal: Negative.   Skin: Negative.   Neurological: Negative.   Endo/Heme/Allergies: Negative.   Psychiatric/Behavioral: Positive for substance abuse.    Blood pressure (!) 95/50, pulse 81, temperature 98.5 F (36.9 C), temperature source Oral, resp. rate 18, SpO2 90 %.There is no height or weight on file to calculate BMI.  General Appearance: Casual  Eye Contact:  Good  Speech:  Normal Rate  Volume:  Normal  Mood:   Euthymic  Affect:  Congruent  Thought Process:  Coherent and Descriptions of Associations: Intact  Orientation:  Full (Time, Place, and Person)  Thought Content:  WDL  Suicidal Thoughts:  No  Homicidal Thoughts:  No  Memory:  Immediate;   Good Recent;   Good Remote;   Good  Judgement:  Fair  Insight:  Fair  Psychomotor Activity:  Normal  Concentration:  Concentration: Good and Attention Span: Good  Recall:  Good  Fund of Knowledge:  Fair  Language:  Good  Akathisia:  No  Handed:  Right  AIMS (if indicated):     Assets:  Housing Leisure Time Physical Health Resilience Social Support  ADL's:  Intact  Cognition:  WNL  Sleep:      Mental Status Per Nursing Assessment::   On Admission:   Anger, aggression  Demographic Factors:  Male  Loss Factors: NA  Historical Factors: Impulsivity  Risk Reduction Factors:   Sense of responsibility to family, Living with another person, especially a relative, Positive social support and Positive therapeutic relationship  Continued Clinical Symptoms:  None  Cognitive Features That Contribute To Risk:  None    Suicide Risk:  Minimal: No identifiable suicidal ideation.  Patients presenting with no risk factors but with morbid ruminations; may be classified as minimal risk based on the severity of the depressive symptoms    Plan Of Care/Follow-up recommendations:  Activity:  as tolerated Diet:  heart healthy diet  Shamica Moree, NP 10/13/2015, 11:26 AM

## 2015-10-13 NOTE — BH Assessment (Signed)
[place] Assessment Progress Note  Per Corena Pilgrim, MD, this pt does not require psychiatric hospitalization at this time.  Pt presents under IVC, which Dr Darleene Cleaver has rescinded.  Pt is to be discharged from [facility] with recommendation to follow up with [facility].  [facility] information has been included in pt's discharge instructions.  Pt's nurse, Levada Dy, has been notified.  Jalene Mullet, [place] Triage Specialist [phone]
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
## 2015-10-13 NOTE — Discharge Instructions (Signed)
For your ongoing mental health needs, you are advised to follow up with [facility].  New and returning patients are seen at their walk-in clinic.  Walk-in hours are Monday - Friday from 8:00 am - 3:00 pm.  Walk-in patients are seen on a first come, first served basis.  Try to arrive as early as possible for he best chance of being seen the same day: ° °     [facility] °     [address] °     [place], [place] [address] °     [phone] °

## 2015-10-13 NOTE — ED Notes (Signed)
Patient denies SI/HI and A/V hallucinations, although patient appears to be responding to internal stimuli, Dr. Loni Muse made aware. Patient has been interacting appropriately with others and states he is ready to go. Patient is being discharged home. All belongings returned to patient from locker 75. Patient verbalized understanding of AVS discharge instructions and follow up, no further questions at this time.   Nikitta Sobiech, [place] Dales, RN

## 2016-02-13 ENCOUNTER — Encounter (HOSPITAL_COMMUNITY): Payer: Self-pay

## 2016-02-13 ENCOUNTER — Emergency Department (HOSPITAL_COMMUNITY)
Admission: EM | Admit: 2016-02-13 | Discharge: 2016-02-13 | Disposition: A | Discharge status: Home / Self Care | Payer: Medicaid Other | Attending: Emergency Medicine | Admitting: Emergency Medicine

## 2016-02-13 DIAGNOSIS — M7989 Other specified soft tissue disorders: Secondary | ICD-10-CM | POA: Insufficient documentation

## 2016-02-13 DIAGNOSIS — F17210 Nicotine dependence, cigarettes, uncomplicated: Secondary | ICD-10-CM | POA: Diagnosis not present

## 2016-02-13 DIAGNOSIS — R600 Localized edema: Secondary | ICD-10-CM | POA: Diagnosis not present

## 2016-02-13 MED ORDER — ENOXAPARIN SODIUM 80 MG/0.8ML ~~LOC~~ SOLN
1.0000 mg/kg | Freq: Once | SUBCUTANEOUS | Status: AC
  Administered 2016-02-13: 70 mg via SUBCUTANEOUS
  Filled 2016-02-13 (×2): qty 0.8

## 2016-02-13 NOTE — ED Triage Notes (Signed)
Patient complains of right lower leg swelling x 1 week. Denies trauma, no pain, no redness.  NAD.

## 2016-02-13 NOTE — Discharge Instructions (Signed)
Return for an ultrasound of your leg tomorrow.

## 2016-02-14 ENCOUNTER — Ambulatory Visit (HOSPITAL_COMMUNITY)
Admission: RE | Admit: 2016-02-14 | Discharge: 2016-02-14 | Disposition: A | Discharge status: Home / Self Care | Payer: Medicaid Other | Source: Ambulatory Visit | Attending: Emergency Medicine | Admitting: Emergency Medicine

## 2016-02-14 DIAGNOSIS — M79604 Pain in right leg: Secondary | ICD-10-CM | POA: Diagnosis not present

## 2016-02-14 DIAGNOSIS — M7989 Other specified soft tissue disorders: Secondary | ICD-10-CM | POA: Insufficient documentation

## 2016-02-14 DIAGNOSIS — M79609 Pain in unspecified limb: Secondary | ICD-10-CM | POA: Diagnosis not present

## 2016-02-14 NOTE — Progress Notes (Signed)
VASCULAR LAB PRELIMINARY  PRELIMINARY  PRELIMINARY  PRELIMINARY  Right lower extremity venous duplex completed.    Preliminary report:  Right:  No evidence of DVT, superficial thrombosis, or Baker's cyst.  Tyler Dominguez, RVS 02/14/2016, 10:47 AM
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
## 2016-07-11 ENCOUNTER — Emergency Department (HOSPITAL_COMMUNITY)
Admission: EM | Admit: 2016-07-11 | Discharge: 2016-07-15 | Disposition: A | Discharge status: Home / Self Care | Payer: Medicaid Other | Attending: Emergency Medicine | Admitting: Emergency Medicine

## 2016-07-11 ENCOUNTER — Encounter (HOSPITAL_COMMUNITY): Payer: Self-pay | Admitting: Emergency Medicine

## 2016-07-11 DIAGNOSIS — F3111 Bipolar disorder, current episode manic without psychotic features, mild: Secondary | ICD-10-CM | POA: Diagnosis not present

## 2016-07-11 DIAGNOSIS — Z5181 Encounter for therapeutic drug level monitoring: Secondary | ICD-10-CM | POA: Diagnosis not present

## 2016-07-11 DIAGNOSIS — F918 Other conduct disorders: Secondary | ICD-10-CM | POA: Insufficient documentation

## 2016-07-11 DIAGNOSIS — F17210 Nicotine dependence, cigarettes, uncomplicated: Secondary | ICD-10-CM | POA: Insufficient documentation

## 2016-07-11 DIAGNOSIS — F319 Bipolar disorder, unspecified: Secondary | ICD-10-CM | POA: Diagnosis present

## 2016-07-11 DIAGNOSIS — F1220 Cannabis dependence, uncomplicated: Secondary | ICD-10-CM | POA: Diagnosis present

## 2016-07-11 DIAGNOSIS — R4689 Other symptoms and signs involving appearance and behavior: Secondary | ICD-10-CM

## 2016-07-11 DIAGNOSIS — F22 Delusional disorders: Secondary | ICD-10-CM | POA: Diagnosis present

## 2016-07-11 DIAGNOSIS — F6381 Intermittent explosive disorder: Secondary | ICD-10-CM | POA: Diagnosis not present

## 2016-07-11 LAB — COMPREHENSIVE METABOLIC PANEL
ALK PHOS: 86 U/L (ref 38–126)
ALT: 10 U/L — AB (ref 17–63)
ANION GAP: 8 (ref 5–15)
AST: 14 U/L — ABNORMAL LOW (ref 15–41)
Albumin: 3.6 g/dL (ref 3.5–5.0)
BUN: 11 mg/dL (ref 6–20)
CALCIUM: 9.1 mg/dL (ref 8.9–10.3)
CO2: 29 mmol/L (ref 22–32)
CREATININE: 0.67 mg/dL (ref 0.61–1.24)
Chloride: 100 mmol/L — ABNORMAL LOW (ref 101–111)
Glucose, Bld: 98 mg/dL (ref 65–99)
Potassium: 3.7 mmol/L (ref 3.5–5.1)
SODIUM: 137 mmol/L (ref 135–145)
TOTAL PROTEIN: 8.5 g/dL — AB (ref 6.5–8.1)
Total Bilirubin: 0.5 mg/dL (ref 0.3–1.2)

## 2016-07-11 LAB — CBC WITH DIFFERENTIAL/PLATELET
Basophils Absolute: 0 10*3/uL (ref 0.0–0.1)
Basophils Relative: 0 %
Eosinophils Absolute: 0.1 10*3/uL (ref 0.0–0.7)
Eosinophils Relative: 1 %
HEMATOCRIT: 35.5 % — AB (ref 39.0–52.0)
Hemoglobin: 11.3 g/dL — ABNORMAL LOW (ref 13.0–17.0)
LYMPHS PCT: 18 %
Lymphs Abs: 1.5 10*3/uL (ref 0.7–4.0)
MCH: 23.1 pg — ABNORMAL LOW (ref 26.0–34.0)
MCHC: 31.8 g/dL (ref 30.0–36.0)
MCV: 72.6 fL — AB (ref 78.0–100.0)
MONO ABS: 0.4 10*3/uL (ref 0.1–1.0)
MONOS PCT: 4 %
NEUTROS ABS: 6.4 10*3/uL (ref 1.7–7.7)
Neutrophils Relative %: 77 %
Platelets: 571 10*3/uL — ABNORMAL HIGH (ref 150–400)
RBC: 4.89 MIL/uL (ref 4.22–5.81)
RDW: 20 % — AB (ref 11.5–15.5)
WBC: 8.3 10*3/uL (ref 4.0–10.5)

## 2016-07-11 LAB — ETHANOL: Alcohol, Ethyl (B): <5 mg/dL (ref <5)

## 2016-07-11 LAB — SALICYLATE LEVEL: Salicylate Lvl: <7 mg/dL (ref 2.8–30.0)

## 2016-07-11 LAB — ACETAMINOPHEN LEVEL: Acetaminophen (Tylenol), Serum: <10 ug/mL — ABNORMAL LOW (ref 10–30)
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
## 2016-07-11 NOTE — ED Notes (Signed)
Bed: WA17 Expected date:  Expected time:  Means of arrival:  Comments: Pt from [facility], combative

## 2016-07-11 NOTE — BHH Counselor (Signed)
Per Larose Kells, no appropriate beds available at [facility].   Edd Fabian, MS, [facility], [facility] Triage Specialist [phone]

## 2016-07-11 NOTE — ED Notes (Signed)
Pt. Unable to give specimen. Will collect specimen when pt. Voids. Nurses aware.
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
## 2016-07-11 NOTE — ED Notes (Addendum)
Unable to get urine sample from pt because he will not give one.

## 2016-07-11 NOTE — ED Notes (Signed)
Pt refuses to wear yellow grip socks
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
## 2016-07-11 NOTE — ED Notes (Signed)
Unable to draw blood, due to patient his behavior and hallucinations.

## 2016-07-12 DIAGNOSIS — F17210 Nicotine dependence, cigarettes, uncomplicated: Secondary | ICD-10-CM

## 2016-07-12 DIAGNOSIS — F1220 Cannabis dependence, uncomplicated: Secondary | ICD-10-CM | POA: Diagnosis not present

## 2016-07-12 DIAGNOSIS — F6381 Intermittent explosive disorder: Secondary | ICD-10-CM

## 2016-07-12 LAB — URINALYSIS, ROUTINE W REFLEX MICROSCOPIC
BILIRUBIN URINE: NEGATIVE
GLUCOSE, UA: NEGATIVE mg/dL
Hgb urine dipstick: NEGATIVE
KETONES UR: 5 mg/dL — AB
Leukocytes, UA: NEGATIVE
Nitrite: NEGATIVE
PH: 7 (ref 5.0–8.0)
Protein, ur: NEGATIVE mg/dL
Specific Gravity, Urine: 1.012 (ref 1.005–1.030)

## 2016-07-12 LAB — RAPID URINE DRUG SCREEN, HOSP PERFORMED
Amphetamines: NOT DETECTED
BENZODIAZEPINES: NOT DETECTED
Barbiturates: NOT DETECTED
COCAINE: NOT DETECTED
OPIATES: NOT DETECTED
TETRAHYDROCANNABINOL: POSITIVE — AB

## 2016-07-12 MED ORDER — TRAZODONE HCL 100 MG PO TABS: 100.0000 mg | ORAL_TABLET | Freq: Every evening | ORAL | Status: DC | PRN

## 2016-07-12 MED ORDER — ASENAPINE MALEATE 5 MG SL SUBL
10.0000 mg | SUBLINGUAL_TABLET | Freq: Two times a day (BID) | SUBLINGUAL | Status: DC | PRN
  Administered 2016-07-13: 10 mg via SUBLINGUAL
  Filled 2016-07-12: qty 2

## 2016-07-12 NOTE — ED Notes (Signed)
SBAR Report received from previous nurse. Pt received calm and visible on unit. Pt currently resting and denies current SI/ HI, A/V H, depression, anxiety, or pain at this time, and appears otherwise stable and free of distress. Pt reminded of camera surveillance, q 15 min rounds, and rules of the milieu. Will continue to assess.

## 2016-07-12 NOTE — BHH Counselor (Signed)
Pt was referred to the following inpatient facilities:   [facility]  TTS will continue to follow up.   Edd Fabian, MS, [facility], [facility] Triage Specialist [phone]
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
## 2016-07-12 NOTE — ED Notes (Signed)
Pt calls staff, "Dick sucking faggots."

## 2016-07-12 NOTE — ED Notes (Signed)
Pt moved into room 34 on his own. He preferred that room. Pt appears to have poor comprehension, but he redirects.
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
## 2016-07-13 NOTE — ED Notes (Signed)
Pt was being intrusive and verbally aggressive at nurses station.  He refuses to bathe until he is discharged after being told multiple time that he needed to bathe.  He has severe body odor.  Pt denies S/I, H/I, and AVH.

## 2016-07-13 NOTE — ED Notes (Signed)
Pt resting at present, no distress noted, calm & cooperative at present.  Monitoring for safety, Q 15 min checks in effect. 

## 2016-07-13 NOTE — ED Notes (Addendum)
Pt asleep, this writer did not wake for vitals. RN notified. Will take vitals when patient awakes.

## 2016-07-14 DIAGNOSIS — F17210 Nicotine dependence, cigarettes, uncomplicated: Secondary | ICD-10-CM | POA: Diagnosis not present

## 2016-07-14 DIAGNOSIS — F1220 Cannabis dependence, uncomplicated: Secondary | ICD-10-CM | POA: Diagnosis not present

## 2016-07-14 DIAGNOSIS — F6381 Intermittent explosive disorder: Secondary | ICD-10-CM | POA: Diagnosis not present

## 2016-07-14 NOTE — ED Notes (Signed)
Talking w/ Dr Tiajuana Amass in hall

## 2016-07-14 NOTE — ED Notes (Signed)
Up walking in hall/front of NS, smiling, talking at times, appears to be responding to internal stimulus

## 2016-07-14 NOTE — ED Notes (Signed)
SBAR Report received from previous nurse. Pt received calm and visible on unit. Pt denies current SI/ HI, A/V H, depression, anxiety, or pain at this time, and appears otherwise stable and free of distress. Pt reminded of camera surveillance, q 15 min rounds, and rules of the milieu. Will continue to assess. 

## 2016-07-14 NOTE — ED Notes (Signed)
resting quietly in room

## 2016-07-14 NOTE — ED Notes (Signed)
In room Eating breakfast

## 2016-07-14 NOTE — Progress Notes (Signed)
CSW faxed patient's referral to: [facility] [place] [place]  Clinical Social Worker [phone]

## 2016-07-14 NOTE — ED Notes (Signed)
On the phone 

## 2016-07-14 NOTE — ED Notes (Signed)
Up to the desk, talkative, rambling about his cousin, [employer],
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
## 2016-07-14 NOTE — ED Notes (Signed)
Up to the desk talking, rambling about cell phone, money, check.

## 2016-07-15 DIAGNOSIS — R4589 Other symptoms and signs involving emotional state: Secondary | ICD-10-CM

## 2016-07-15 DIAGNOSIS — F6381 Intermittent explosive disorder: Secondary | ICD-10-CM | POA: Diagnosis not present

## 2016-07-15 DIAGNOSIS — F1290 Cannabis use, unspecified, uncomplicated: Secondary | ICD-10-CM

## 2016-07-15 DIAGNOSIS — F17210 Nicotine dependence, cigarettes, uncomplicated: Secondary | ICD-10-CM | POA: Diagnosis not present

## 2016-07-15 NOTE — ED Notes (Signed)
Pt discharged ambulatory with discharge instructions and a bus pass.  Pt was calm and cooperative at discharge.  All belongings were returned to pt.
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
## 2016-07-15 NOTE — Discharge Instructions (Signed)
For your ongoing mental health needs, you are advised to follow up with [facility].  New and returning patients are seen at their walk-in clinic.  Walk-in hours are Monday - Friday from 8:00 am - 3:00 pm.  Walk-in patients are seen on a first come, first served basis.  Try to arrive as early as possible for he best chance of being seen the same day: ° °     [facility] °     [address] °     [place], [place] [address] °     [phone] °
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
## 2016-07-15 NOTE — BH Assessment (Addendum)
[place] Assessment Progress Note  Per Corena Pilgrim, MD, this pt does not require psychiatric hospitalization at this time.  Pt presents under IVC initiated by Laurence Slate, LCSW at [facility], which Dr Darleene Cleaver previously upheld, but has now rescinded.  Pt is to be discharged from [facility] with recommendation to follow up with [facility].  This has been included in pt's discharge instructions.  Pt's nurse, Nena Jordan, has been notified.  Jalene Mullet, [place] Triage Specialist [phone]

## 2017-01-01 ENCOUNTER — Telehealth: Payer: Self-pay | Admitting: Rehabilitative and Restorative Service Providers"

## 2017-01-01 ENCOUNTER — Ambulatory Visit: Payer: Medicaid Other | Attending: Internal Medicine | Admitting: Rehabilitative and Restorative Service Providers"

## 2017-01-01 DIAGNOSIS — R2689 Other abnormalities of gait and mobility: Secondary | ICD-10-CM

## 2017-01-01 DIAGNOSIS — M6281 Muscle weakness (generalized): Secondary | ICD-10-CM | POA: Insufficient documentation

## 2017-01-01 DIAGNOSIS — R293 Abnormal posture: Secondary | ICD-10-CM

## 2017-01-01 DIAGNOSIS — R29818 Other symptoms and signs involving the nervous system: Secondary | ICD-10-CM | POA: Diagnosis present

## 2017-01-01 NOTE — Telephone Encounter (Signed)
Patient's mother had contacted our clinic within 30 minutes of evaluation and wanted to speak with PT about request for x-rays or MRI.  PT re-educated the patient's mother on my recommendations and let her know the process.  I plan to finish typing the evaluation this afternoon, then to call/fax to [facility] with recommendation for further cervical imaging secondary to worsening gait, dec'd ability to tie shoes, + babinski sign.  They will then handle ordering that if they agree as PT does not order imaging.  Patient's mother verbalizes understanding.  Meylin Stenzel, PT
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
## 2017-01-01 NOTE — Telephone Encounter (Signed)
PT called to ensure note faxed from [facility] arrived.  Spoke with Vicente Males at [facility] (Dr. Jeanie Cooks).  He recommended I refax to [phone].  PT is sending initial evaluation by fax.    ALSO: Patient presenting with gait ataxia, increased muscle tone, positive babinski, difficulty tying shoes. Has he had MRI in the last year of c-spine?    If so, can results be faxed to me at [phone]?  If not, he may benefit from imaging to rule out cervical myelopathy.  Thank you for the referral of this patient. Rudell Cobb, MPT  [facility] [address] [place], [place]  [address] Phone:  [phone] Fax:  [phone]

## 2017-01-21 ENCOUNTER — Other Ambulatory Visit (HOSPITAL_COMMUNITY): Payer: Self-pay | Admitting: Internal Medicine

## 2017-01-21 DIAGNOSIS — R2689 Other abnormalities of gait and mobility: Secondary | ICD-10-CM

## 2017-01-28 ENCOUNTER — Encounter (HOSPITAL_COMMUNITY): Payer: Self-pay | Admitting: *Deleted

## 2017-01-28 ENCOUNTER — Emergency Department (HOSPITAL_COMMUNITY): Payer: Medicaid Other

## 2017-01-28 ENCOUNTER — Ambulatory Visit (HOSPITAL_COMMUNITY): Admission: RE | Admit: 2017-01-28 | Payer: Medicaid Other | Source: Ambulatory Visit

## 2017-01-28 ENCOUNTER — Emergency Department (HOSPITAL_COMMUNITY)
Admission: EM | Admit: 2017-01-28 | Discharge: 2017-01-28 | Disposition: A | Discharge status: Home / Self Care | Payer: Medicaid Other | Attending: Emergency Medicine | Admitting: Emergency Medicine

## 2017-01-28 DIAGNOSIS — Z87820 Personal history of traumatic brain injury: Secondary | ICD-10-CM | POA: Diagnosis not present

## 2017-01-28 DIAGNOSIS — F17210 Nicotine dependence, cigarettes, uncomplicated: Secondary | ICD-10-CM | POA: Diagnosis not present

## 2017-01-28 DIAGNOSIS — N390 Urinary tract infection, site not specified: Secondary | ICD-10-CM

## 2017-01-28 DIAGNOSIS — M545 Low back pain: Secondary | ICD-10-CM | POA: Diagnosis present

## 2017-01-28 DIAGNOSIS — N3944 Nocturnal enuresis: Secondary | ICD-10-CM | POA: Insufficient documentation

## 2017-01-28 DIAGNOSIS — M542 Cervicalgia: Secondary | ICD-10-CM | POA: Diagnosis not present

## 2017-01-28 DIAGNOSIS — G8929 Other chronic pain: Secondary | ICD-10-CM | POA: Diagnosis not present

## 2017-01-28 LAB — URINALYSIS, ROUTINE W REFLEX MICROSCOPIC
BILIRUBIN URINE: NEGATIVE
Glucose, UA: NEGATIVE mg/dL
HGB URINE DIPSTICK: NEGATIVE
Ketones, ur: NEGATIVE mg/dL
Nitrite: NEGATIVE
PH: 6 (ref 5.0–8.0)
Protein, ur: NEGATIVE mg/dL
SPECIFIC GRAVITY, URINE: 1.028 (ref 1.005–1.030)

## 2017-01-28 MED ORDER — CEPHALEXIN 500 MG PO CAPS: 500.0000 mg | ORAL_CAPSULE | Freq: Four times a day (QID) | ORAL | 0 refills | Status: DC

## 2017-01-28 NOTE — ED Notes (Signed)
Pts mother at the bedside requesting xray; pt c/o leg pain, slower walking, laying around at home, pt c/o pain with moving joints; pt states he cannot tie his shoes anymore; pt states he has to use his arms to help get himself off toilet now; mother now c/o changes to bilat feet

## 2017-01-28 NOTE — ED Notes (Signed)
Pt went to cafeteria 

## 2017-01-28 NOTE — ED Notes (Signed)
Pt undressed and into hospital gown; urine sample supplied
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
## 2017-01-28 NOTE — ED Notes (Signed)
Patient transported to MRI 

## 2017-02-17 ENCOUNTER — Ambulatory Visit: Payer: Medicaid Other | Attending: Internal Medicine | Admitting: Rehabilitative and Restorative Service Providers"

## 2017-02-17 ENCOUNTER — Encounter: Payer: Self-pay | Admitting: Rehabilitative and Restorative Service Providers"

## 2017-02-17 DIAGNOSIS — R293 Abnormal posture: Secondary | ICD-10-CM | POA: Insufficient documentation

## 2017-02-17 DIAGNOSIS — R29818 Other symptoms and signs involving the nervous system: Secondary | ICD-10-CM | POA: Diagnosis present

## 2017-02-17 DIAGNOSIS — R2689 Other abnormalities of gait and mobility: Secondary | ICD-10-CM | POA: Diagnosis not present

## 2017-02-17 DIAGNOSIS — M6281 Muscle weakness (generalized): Secondary | ICD-10-CM | POA: Insufficient documentation
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
## 2017-02-17 NOTE — Patient Instructions (Signed)
CAREGIVER ASSISTED: Hamstrings - Supine    Caregiver holds leg at ankle and over knee; raises leg straight. Hold __30_ seconds. __3_ reps on each leg per set, 1-2 times per day.   Copyright  VHI. All rights reserved.   CAREGIVER ASSISTED: Hip Internal Rotation    Caregiver holds leg at knee and at ankle; rotates lower leg outward, keeping knee in line with hip. Hold _30__ seconds. _3__ reps on each leg per set, 1-2 times per day.  Copyright  VHI. All rights reserved.  CAREGIVER ASSISTED: Adductors    Caregiver moves leg out to side, keeping toes pointed up. Hold __30_ seconds. __3_ reps per leg per set, 1-2 times per day.    Copyright  VHI. All rights reserved.   SHOULDER: Abduction - Supine    Raise arms out and up at same speed with palms up. Keep elbows straight. Do not shrug shoulders. _5__ reps per set, 1-2 times per day. STOP WHEN ARMS ARE OUT TO THE SIDE.    Copyright  VHI. All rights reserved.   SUPINE: Shoulder Flexion    With the left arm, reach up for the ceiling and back down by your side 5 times.   Repeat 1-2 times per day.  Copyright  VHI. All rights reserved.

## 2017-02-26 ENCOUNTER — Ambulatory Visit: Payer: Medicaid Other | Admitting: Rehabilitative and Restorative Service Providers"

## 2017-02-26 DIAGNOSIS — R29818 Other symptoms and signs involving the nervous system: Secondary | ICD-10-CM

## 2017-02-26 DIAGNOSIS — R293 Abnormal posture: Secondary | ICD-10-CM

## 2017-02-26 DIAGNOSIS — R2689 Other abnormalities of gait and mobility: Secondary | ICD-10-CM

## 2017-02-26 DIAGNOSIS — M6281 Muscle weakness (generalized): Secondary | ICD-10-CM
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
## 2017-03-05 ENCOUNTER — Ambulatory Visit: Payer: Medicaid Other | Admitting: Rehabilitative and Restorative Service Providers"

## 2017-03-05 ENCOUNTER — Encounter: Payer: Self-pay | Admitting: Rehabilitative and Restorative Service Providers"

## 2017-03-05 DIAGNOSIS — M6281 Muscle weakness (generalized): Secondary | ICD-10-CM

## 2017-03-05 DIAGNOSIS — R2689 Other abnormalities of gait and mobility: Secondary | ICD-10-CM

## 2017-03-05 DIAGNOSIS — R29818 Other symptoms and signs involving the nervous system: Secondary | ICD-10-CM

## 2017-03-05 DIAGNOSIS — R293 Abnormal posture: Secondary | ICD-10-CM
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
## 2017-03-15 ENCOUNTER — Encounter (HOSPITAL_COMMUNITY): Payer: Self-pay

## 2017-03-15 ENCOUNTER — Emergency Department (HOSPITAL_COMMUNITY)
Admission: EM | Admit: 2017-03-15 | Discharge: 2017-03-15 | Disposition: A | Discharge status: Home / Self Care | Payer: Medicaid Other | Attending: Emergency Medicine | Admitting: Emergency Medicine

## 2017-03-15 ENCOUNTER — Other Ambulatory Visit: Payer: Self-pay

## 2017-03-15 DIAGNOSIS — F17210 Nicotine dependence, cigarettes, uncomplicated: Secondary | ICD-10-CM | POA: Diagnosis not present

## 2017-03-15 DIAGNOSIS — M7918 Myalgia, other site: Secondary | ICD-10-CM | POA: Diagnosis present

## 2017-03-15 MED ORDER — DICLOFENAC SODIUM 1 % TD GEL: 2.0000 g | Freq: Four times a day (QID) | TRANSDERMAL | 0 refills | Status: DC

## 2017-03-15 MED ORDER — KETOROLAC TROMETHAMINE 60 MG/2ML IM SOLN
30.0000 mg | Freq: Once | INTRAMUSCULAR | Status: AC
  Administered 2017-03-15: 30 mg via INTRAMUSCULAR
  Filled 2017-03-15: qty 2

## 2017-03-15 NOTE — Discharge Instructions (Signed)
Take acetaminophen (Tylenol) up to 975 mg (this is normally 3 over-the-counter pills) up to 3 times a day. Do not drink alcohol. Make sure your other medications do not contain acetaminophen (Read the labels!) ° °

## 2017-03-15 NOTE — ED Triage Notes (Signed)
He has numerous complaints--too numerous to list. He c/o multiple areas of pain "because of how a door got opened". And he "can't make it to the bathroom; I pee on myself". He is healthy-looking and in no distress.
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
## 2017-03-20 ENCOUNTER — Ambulatory Visit: Payer: Medicaid Other | Attending: Internal Medicine | Admitting: Rehabilitative and Restorative Service Providers"

## 2017-03-20 ENCOUNTER — Encounter: Payer: Self-pay | Admitting: Rehabilitative and Restorative Service Providers"

## 2017-03-20 DIAGNOSIS — R29818 Other symptoms and signs involving the nervous system: Secondary | ICD-10-CM | POA: Diagnosis present

## 2017-03-20 DIAGNOSIS — M6281 Muscle weakness (generalized): Secondary | ICD-10-CM | POA: Insufficient documentation

## 2017-03-20 DIAGNOSIS — R293 Abnormal posture: Secondary | ICD-10-CM | POA: Diagnosis present

## 2017-03-20 DIAGNOSIS — R2689 Other abnormalities of gait and mobility: Secondary | ICD-10-CM | POA: Insufficient documentation
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
## 2017-03-28 ENCOUNTER — Encounter: Payer: Self-pay | Admitting: Rehabilitative and Restorative Service Providers"

## 2017-03-28 ENCOUNTER — Ambulatory Visit: Payer: Medicaid Other | Admitting: Rehabilitative and Restorative Service Providers"

## 2017-03-28 DIAGNOSIS — R2689 Other abnormalities of gait and mobility: Secondary | ICD-10-CM | POA: Diagnosis not present

## 2017-03-28 DIAGNOSIS — R29818 Other symptoms and signs involving the nervous system: Secondary | ICD-10-CM

## 2017-03-28 DIAGNOSIS — R293 Abnormal posture: Secondary | ICD-10-CM
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
## 2017-04-03 ENCOUNTER — Ambulatory Visit: Payer: Self-pay | Admitting: Rehabilitative and Restorative Service Providers"

## 2017-04-09 ENCOUNTER — Ambulatory Visit: Payer: Self-pay | Admitting: Rehabilitative and Restorative Service Providers"

## 2017-06-23 ENCOUNTER — Encounter (HOSPITAL_COMMUNITY): Payer: Self-pay | Admitting: Emergency Medicine

## 2017-06-23 ENCOUNTER — Ambulatory Visit (HOSPITAL_COMMUNITY)
Admission: EM | Admit: 2017-06-23 | Discharge: 2017-06-23 | Disposition: A | Discharge status: Home / Self Care | Payer: Medicaid Other | Attending: Family Medicine | Admitting: Family Medicine

## 2017-06-23 ENCOUNTER — Ambulatory Visit (INDEPENDENT_AMBULATORY_CARE_PROVIDER_SITE_OTHER): Payer: Medicaid Other

## 2017-06-23 DIAGNOSIS — Z79899 Other long term (current) drug therapy: Secondary | ICD-10-CM | POA: Insufficient documentation

## 2017-06-23 DIAGNOSIS — R3981 Functional urinary incontinence: Secondary | ICD-10-CM

## 2017-06-23 DIAGNOSIS — M79642 Pain in left hand: Secondary | ICD-10-CM | POA: Diagnosis present

## 2017-06-23 DIAGNOSIS — M7989 Other specified soft tissue disorders: Secondary | ICD-10-CM | POA: Diagnosis not present

## 2017-06-23 DIAGNOSIS — F17210 Nicotine dependence, cigarettes, uncomplicated: Secondary | ICD-10-CM | POA: Insufficient documentation

## 2017-06-23 DIAGNOSIS — F319 Bipolar disorder, unspecified: Secondary | ICD-10-CM | POA: Diagnosis not present

## 2017-06-23 DIAGNOSIS — F39 Unspecified mood [affective] disorder: Secondary | ICD-10-CM | POA: Diagnosis not present

## 2017-06-23 DIAGNOSIS — Z87828 Personal history of other (healed) physical injury and trauma: Secondary | ICD-10-CM | POA: Insufficient documentation

## 2017-06-23 LAB — POCT URINALYSIS DIP (DEVICE)
Bilirubin Urine: NEGATIVE
Glucose, UA: 100 mg/dL — AB
Ketones, ur: NEGATIVE mg/dL
Leukocytes, UA: NEGATIVE
NITRITE: NEGATIVE
Protein, ur: NEGATIVE mg/dL
Specific Gravity, Urine: 1.02 (ref 1.005–1.030)
UROBILINOGEN UA: 1 mg/dL (ref 0.0–1.0)
pH: 5.5 (ref 5.0–8.0)

## 2017-06-23 LAB — GLUCOSE, CAPILLARY: Glucose-Capillary: 115 mg/dL — ABNORMAL HIGH (ref 65–99)

## 2017-06-23 MED ORDER — DICLOFENAC SODIUM 1 % TD GEL: 2.0000 g | Freq: Four times a day (QID) | TRANSDERMAL | 0 refills | Status: DC

## 2017-06-23 NOTE — ED Triage Notes (Signed)
Pt sts left hand pain and swelling upon waking today
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
## 2017-06-23 NOTE — ED Notes (Signed)
Vascular lab called.  Spoke to [employer].  9:00 am appointment tomorrow at [facility]

## 2017-06-23 NOTE — Discharge Instructions (Addendum)
Cause of the left hand/arm swelling is not clear.  Xray was negative for bony injury/fracture.  Please go to the main hospital for an ultrasound of the left arm, to check for blood clot.  Prescription for diclofenac gel, an anti-inflammatory/pain reliever, was sent to the pharmacy.  Ice may also help decrease pain.  Urine culture is pending, to check for urinary tract infection and other causes of urinary frequency.  If there evidence of infection, the urgent care will contact you for treatment.

## 2017-06-24 ENCOUNTER — Ambulatory Visit (HOSPITAL_COMMUNITY)
Admit: 2017-06-24 | Discharge: 2017-06-24 | Disposition: A | Discharge status: Home / Self Care | Payer: Medicaid Other | Attending: Internal Medicine | Admitting: Internal Medicine

## 2017-06-24 ENCOUNTER — Telehealth (HOSPITAL_COMMUNITY): Payer: Self-pay | Admitting: Emergency Medicine

## 2017-06-24 DIAGNOSIS — M7989 Other specified soft tissue disorders: Secondary | ICD-10-CM | POA: Insufficient documentation

## 2017-06-24 DIAGNOSIS — M79609 Pain in unspecified limb: Secondary | ICD-10-CM

## 2017-06-24 LAB — URINE CULTURE

## 2017-06-24 LAB — URINE CYTOLOGY ANCILLARY ONLY
CHLAMYDIA, DNA PROBE: NEGATIVE
NEISSERIA GONORRHEA: NEGATIVE
TRICH (WINDOWPATH): NEGATIVE

## 2017-06-24 NOTE — Telephone Encounter (Signed)
Lab called to report negative findings on venous doppler.  Pt recommended to follow up with PCP for further evaluation if needed.

## 2017-06-24 NOTE — Progress Notes (Signed)
Prelliminary notes by tech--Left upper extremity venous duplex study completed. Negative for DVT. Incidental finding: a cystic structure with internal echoes seen in Thyroid. Result notified ordering physician's office by phone.   Hongying Danean Marner(RDMS RVT) 06/24/17 11:31 AM

## 2017-06-25 ENCOUNTER — Telehealth (HOSPITAL_COMMUNITY): Payer: Self-pay

## 2017-06-25 NOTE — Telephone Encounter (Signed)
Attempted contacted regarding urine culture did not suggest a UTI.  Also need to notifiy of STD screening being negative. Recheck or followup with PCP for further evaluation if symptoms are not improving.

## 2018-05-28 ENCOUNTER — Encounter (HOSPITAL_COMMUNITY): Payer: Self-pay | Admitting: Family Medicine

## 2018-05-28 ENCOUNTER — Inpatient Hospital Stay (HOSPITAL_COMMUNITY)
Admission: EM | Admit: 2018-05-28 | Discharge: 2018-06-02 | DRG: 390 | Disposition: A | Discharge status: Home / Self Care | Payer: Medicaid Other | Source: Ambulatory Visit | Attending: Internal Medicine | Admitting: Internal Medicine

## 2018-05-28 ENCOUNTER — Encounter (HOSPITAL_COMMUNITY): Payer: Self-pay | Admitting: Internal Medicine

## 2018-05-28 ENCOUNTER — Other Ambulatory Visit: Payer: Self-pay

## 2018-05-28 ENCOUNTER — Ambulatory Visit (HOSPITAL_COMMUNITY)
Admission: EM | Admit: 2018-05-28 | Discharge: 2018-05-28 | Disposition: A | Discharge status: Home / Self Care | Payer: Medicaid Other | Attending: Family Medicine | Admitting: Family Medicine

## 2018-05-28 ENCOUNTER — Emergency Department (HOSPITAL_COMMUNITY): Payer: Medicaid Other

## 2018-05-28 DIAGNOSIS — R112 Nausea with vomiting, unspecified: Secondary | ICD-10-CM

## 2018-05-28 DIAGNOSIS — K56600 Partial intestinal obstruction, unspecified as to cause: Secondary | ICD-10-CM | POA: Diagnosis present

## 2018-05-28 DIAGNOSIS — F39 Unspecified mood [affective] disorder: Secondary | ICD-10-CM | POA: Diagnosis not present

## 2018-05-28 DIAGNOSIS — D509 Iron deficiency anemia, unspecified: Secondary | ICD-10-CM | POA: Diagnosis present

## 2018-05-28 DIAGNOSIS — E860 Dehydration: Secondary | ICD-10-CM

## 2018-05-28 DIAGNOSIS — E876 Hypokalemia: Secondary | ICD-10-CM | POA: Diagnosis not present

## 2018-05-28 DIAGNOSIS — F17210 Nicotine dependence, cigarettes, uncomplicated: Secondary | ICD-10-CM | POA: Diagnosis present

## 2018-05-28 DIAGNOSIS — Z791 Long term (current) use of non-steroidal anti-inflammatories (NSAID): Secondary | ICD-10-CM

## 2018-05-28 DIAGNOSIS — Z79899 Other long term (current) drug therapy: Secondary | ICD-10-CM

## 2018-05-28 DIAGNOSIS — Z79890 Hormone replacement therapy (postmenopausal): Secondary | ICD-10-CM | POA: Diagnosis not present

## 2018-05-28 DIAGNOSIS — K56609 Unspecified intestinal obstruction, unspecified as to partial versus complete obstruction: Secondary | ICD-10-CM | POA: Diagnosis present

## 2018-05-28 LAB — COMPREHENSIVE METABOLIC PANEL
ALBUMIN: 3.9 g/dL (ref 3.5–5.0)
ALT: 13 U/L (ref 0–44)
AST: 16 U/L (ref 15–41)
Alkaline Phosphatase: 102 U/L (ref 38–126)
Anion gap: 9 (ref 5–15)
BUN: 19 mg/dL (ref 6–20)
CALCIUM: 9.2 mg/dL (ref 8.9–10.3)
CHLORIDE: 105 mmol/L (ref 98–111)
CO2: 24 mmol/L (ref 22–32)
CREATININE: 0.93 mg/dL (ref 0.61–1.24)
GFR calc Af Amer: >60 mL/min (ref >60)
GFR calc non Af Amer: >60 mL/min (ref >60)
GLUCOSE: 110 mg/dL — AB (ref 70–99)
Potassium: 3.8 mmol/L (ref 3.5–5.1)
SODIUM: 138 mmol/L (ref 135–145)
Total Bilirubin: 0.8 mg/dL (ref 0.3–1.2)
Total Protein: 8.9 g/dL — ABNORMAL HIGH (ref 6.5–8.1)

## 2018-05-28 LAB — URINALYSIS, ROUTINE W REFLEX MICROSCOPIC
Glucose, UA: NEGATIVE mg/dL
Ketones, ur: 5 mg/dL — AB
Leukocytes,Ua: NEGATIVE
Nitrite: NEGATIVE
PH: 5 (ref 5.0–8.0)
PROTEIN: 100 mg/dL — AB
Specific Gravity, Urine: 1.039 — ABNORMAL HIGH (ref 1.005–1.030)

## 2018-05-28 LAB — CBC
HEMATOCRIT: 44 % (ref 39.0–52.0)
Hemoglobin: 13.1 g/dL (ref 13.0–17.0)
MCH: 21.5 pg — AB (ref 26.0–34.0)
MCHC: 29.8 g/dL — ABNORMAL LOW (ref 30.0–36.0)
MCV: 72.1 fL — AB (ref 80.0–100.0)
PLATELETS: 441 10*3/uL — AB (ref 150–400)
RBC: 6.1 MIL/uL — AB (ref 4.22–5.81)
RDW: 20.9 % — ABNORMAL HIGH (ref 11.5–15.5)
WBC: 6.5 10*3/uL (ref 4.0–10.5)
nRBC: 0 % (ref 0.0–0.2)

## 2018-05-28 LAB — LIPASE, BLOOD: LIPASE: 20 U/L (ref 11–51)

## 2018-05-28 MED ORDER — MORPHINE SULFATE (PF) 2 MG/ML IV SOLN: 1.0000 mg | INTRAVENOUS | Status: DC | PRN

## 2018-05-28 MED ORDER — IOHEXOL 300 MG/ML  SOLN
100.0000 mL | Freq: Once | INTRAMUSCULAR | Status: AC | PRN
  Administered 2018-05-28: 100 mL via INTRAVENOUS

## 2018-05-28 MED ORDER — DEXTROSE-NACL 5-0.9 % IV SOLN
INTRAVENOUS | Status: DC
  Administered 2018-05-28 – 2018-05-29 (×2): via INTRAVENOUS

## 2018-05-28 MED ORDER — SODIUM CHLORIDE 0.9 % IV BOLUS
1000.0000 mL | Freq: Once | INTRAVENOUS | Status: AC
  Administered 2018-05-28: 1000 mL via INTRAVENOUS

## 2018-05-28 MED ORDER — ACETAMINOPHEN 325 MG PO TABS: 650.0000 mg | ORAL_TABLET | Freq: Four times a day (QID) | ORAL | Status: DC | PRN

## 2018-05-28 MED ORDER — SODIUM CHLORIDE 0.9 % IV BOLUS: 500.0000 mL | Freq: Once | INTRAVENOUS | Status: DC

## 2018-05-28 MED ORDER — LIDOCAINE VISCOUS HCL 2 % MT SOLN
15.0000 mL | Freq: Once | OROMUCOSAL | Status: DC
  Filled 2018-05-28: qty 15

## 2018-05-28 MED ORDER — ONDANSETRON HCL 4 MG/2ML IJ SOLN
4.0000 mg | Freq: Once | INTRAMUSCULAR | Status: AC
  Administered 2018-05-28: 4 mg via INTRAVENOUS
  Filled 2018-05-28: qty 2

## 2018-05-28 MED ORDER — ONDANSETRON HCL 4 MG PO TABS: 4.0000 mg | ORAL_TABLET | Freq: Four times a day (QID) | ORAL | Status: DC | PRN

## 2018-05-28 MED ORDER — LIDOCAINE HCL URETHRAL/MUCOSAL 2 % EX GEL
1.0000 "application " | Freq: Once | CUTANEOUS | Status: DC
  Filled 2018-05-28: qty 20

## 2018-05-28 MED ORDER — ACETAMINOPHEN 650 MG RE SUPP: 650.0000 mg | Freq: Four times a day (QID) | RECTAL | Status: DC | PRN

## 2018-05-28 MED ORDER — PHENOL 1.4 % MT LIQD
1.0000 | OROMUCOSAL | Status: DC | PRN
  Administered 2018-05-28: 1 via OROMUCOSAL
  Filled 2018-05-28: qty 177

## 2018-05-28 MED ORDER — ONDANSETRON HCL 4 MG/2ML IJ SOLN: 4.0000 mg | Freq: Four times a day (QID) | INTRAMUSCULAR | Status: DC | PRN

## 2018-05-28 NOTE — ED Triage Notes (Signed)
Patient sent from [facility] for dehydration - patient reports N/V onset yesterday, multiple episodes since. Denies abdominal pain, diarrhea, or fevers/chills.

## 2018-05-28 NOTE — ED Triage Notes (Addendum)
Pt has been vomiting since yesterday.  They state he vomited 7-8 times yesterday and 3-4 times today.  Pt is unable to keep any food or ginger ale down. Last episode of vomiting was around 1200 today.
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
## 2018-05-28 NOTE — ED Notes (Signed)
Pt requesting for NG tube to be removed despite multiple times educating pt as to he needs it.  Pt began to curse and states "I'm going to take this out".  Pt's mother was at bedside and attempted to calm pt down and he began to curse at her too.

## 2018-05-28 NOTE — Discharge Instructions (Signed)
You need to go down to the emergency room for further evaluation and treatment.
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
## 2018-05-28 NOTE — ED Notes (Signed)
Attempted report 
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
## 2018-05-29 ENCOUNTER — Inpatient Hospital Stay (HOSPITAL_COMMUNITY): Payer: Medicaid Other

## 2018-05-29 LAB — BASIC METABOLIC PANEL
Anion gap: 8 (ref 5–15)
BUN: 15 mg/dL (ref 6–20)
CHLORIDE: 104 mmol/L (ref 98–111)
CO2: 29 mmol/L (ref 22–32)
Calcium: 8.5 mg/dL — ABNORMAL LOW (ref 8.9–10.3)
Creatinine, Ser: 0.78 mg/dL (ref 0.61–1.24)
GFR calc Af Amer: >60 mL/min (ref >60)
GFR calc non Af Amer: >60 mL/min (ref >60)
Glucose, Bld: 110 mg/dL — ABNORMAL HIGH (ref 70–99)
Potassium: 3.1 mmol/L — ABNORMAL LOW (ref 3.5–5.1)
Sodium: 141 mmol/L (ref 135–145)

## 2018-05-29 LAB — CBC
HEMATOCRIT: 36.6 % — AB (ref 39.0–52.0)
HEMOGLOBIN: 11 g/dL — AB (ref 13.0–17.0)
MCH: 21.5 pg — ABNORMAL LOW (ref 26.0–34.0)
MCHC: 30.1 g/dL (ref 30.0–36.0)
MCV: 71.6 fL — ABNORMAL LOW (ref 80.0–100.0)
Platelets: 373 10*3/uL (ref 150–400)
RBC: 5.11 MIL/uL (ref 4.22–5.81)
RDW: 19.5 % — ABNORMAL HIGH (ref 11.5–15.5)
WBC: 4.8 10*3/uL (ref 4.0–10.5)
nRBC: 0 % (ref 0.0–0.2)

## 2018-05-29 LAB — HEPATIC FUNCTION PANEL
ALT: 9 U/L (ref 0–44)
AST: 10 U/L — ABNORMAL LOW (ref 15–41)
Albumin: 3 g/dL — ABNORMAL LOW (ref 3.5–5.0)
Alkaline Phosphatase: 80 U/L (ref 38–126)
BILIRUBIN TOTAL: 0.6 mg/dL (ref 0.3–1.2)
Bilirubin, Direct: <0.1 mg/dL (ref 0.0–0.2)
Total Protein: 7 g/dL (ref 6.5–8.1)

## 2018-05-29 LAB — GLUCOSE, CAPILLARY
Glucose-Capillary: 104 mg/dL — ABNORMAL HIGH (ref 70–99)
Glucose-Capillary: 105 mg/dL — ABNORMAL HIGH (ref 70–99)

## 2018-05-29 LAB — HIV ANTIBODY (ROUTINE TESTING W REFLEX): HIV Screen 4th Generation wRfx: NONREACTIVE

## 2018-05-29 MED ORDER — POTASSIUM CHLORIDE 10 MEQ/100ML IV SOLN
10.0000 meq | INTRAVENOUS | Status: AC
  Administered 2018-05-29 (×6): 10 meq via INTRAVENOUS
  Filled 2018-05-29: qty 100

## 2018-05-29 MED ORDER — DEXTROSE-NACL 5-0.9 % IV SOLN
INTRAVENOUS | Status: DC
  Administered 2018-05-29 – 2018-05-31 (×4): via INTRAVENOUS

## 2018-05-29 NOTE — Progress Notes (Signed)
Pt NGtube was out when we do bedside report. 1st shift RN said that it's been clamped since 9Am and have some sips of clears today and pt's been tolerating it well. Okay to leave NGT out per Dr Sherral Hammers.

## 2018-05-29 NOTE — TOC Transition Note (Signed)
Transition of Care [facility]) - CM/SW Discharge Note   Patient Details  Name: Tyler Dominguez MRN: [id number] Date of Birth: [birth year]  [facility] CM/SW Contact:  Marilu Favre, RN Phone Number: 05/29/2018, 12:20 PM   Clinical Narrative:     Patient lives with mother.   Confirmed face sheet information.   PCP DR Jeanie Cooks. Mother states patient has transportation to appointments and can have prescriptions filled.    Barriers to Discharge: No Barriers Identified   Patient Goals and CMS Choice Patient states their goals for this hospitalization and ongoing recovery are:: to go home  CMS Medicare.gov Compare Post Acute Care list provided to:: Other (Comment Required)(mother )    Discharge Placement                       Discharge Plan and Services                        Social Determinants of Health (SDOH) Interventions     Readmission Risk Interventions No flowsheet data found.
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
## 2018-05-29 NOTE — Progress Notes (Signed)
  [facility] notified of NGT out.

## 2018-05-29 NOTE — Progress Notes (Signed)
PROGRESS NOTE    Tyler Dominguez  IDP:[id number] DOB: 01/04/1981 DOA: 05/28/2018 PCP: Nolene Ebbs, MD    Brief Narrative:  38 year old with past medical history significant for tobacco abuse, remote disorder who presents to the emergency department complaining of nausea vomiting for 24 hours prior to admission.  CT abdomen showed small bowel obstruction high-grade, due to mesenteric like mass abnormality.  General surgery has been consulted as well as GI.    Assessment & Plan:   Principal Problem:   SBO (small bowel obstruction) (HCC) Active Problems:   Mood disorder (HCC)   Small bowel obstruction (HCC)   1-high-grade small bowel obstruction: Differential includes inflammatory bowel disease and possible mass including carcinoid. General surgery and GI consulted. Continue with NG tube to suction. N.p.o. status. IV fluids. Await surgery and GI recommendation.  2-Hypokalemia; replete with IV KCl.  3-Mood disorder: Hold medications due to n.p.o. status    Estimated body mass index is 25.45 kg/m as calculated from the following:   Height as of this encounter: '5\' 7"'  (1.702 m).   Weight as of this encounter: 73.7 kg.   DVT prophylaxis: SCDs Code Status: Full code Family Communication: No family at the time of my evaluation Disposition Plan; made in the hospital for treatment of a small bowel obstruction  Consultants:   General surgery  GI   Procedures:   None   Antimicrobials:  None   Subjective: He is alert he wants the NG tube followed.  He wants to go home I explained to him that he needs to remain in the hospital for treatment of the small bowel obstruction.   Objective: Vitals:   05/28/18 1637 05/28/18 1819 05/28/18 2211 05/29/18 0500  BP: (!) 121/92 125/87    Pulse: (!) 103 (!) 114    Resp: 18 18    Temp:      TempSrc:      SpO2: 97% 97%    Weight:   72.6 kg 73.7 kg  Height:   '5\' 7"'  (1.702 m)     Intake/Output Summary (Last 24 hours)  at 05/29/2018 1157 Last data filed at 05/29/2018 0944 Gross per 24 hour  Intake 739.83 ml  Output 551 ml  Net 188.83 ml   Filed Weights   05/28/18 2211 05/29/18 0500  Weight: 72.6 kg 73.7 kg    Examination:  General exam: Appears calm and comfortable , NG tube in place Respiratory system: Clear to auscultation. Respiratory effort normal. Cardiovascular system: S1 & S2 heard, RRR. No JVD, murmurs, rubs, gallops or clicks. No pedal edema. Gastrointestinal system: Abdomen distended, nontender soft. Central nervous system: Alert and oriented. No focal neurological deficits. Extremities: Symmetric 5 x 5 power. Skin: No rashes, lesions or ulcers     Data Reviewed: I have personally reviewed following labs and imaging studies  CBC: Recent Labs  Lab 05/28/18 1559 05/29/18 0358  WBC 6.5 4.8  HGB 13.1 11.0*  HCT 44.0 36.6*  MCV 72.1* 71.6*  PLT 441* 443   Basic Metabolic Panel: Recent Labs  Lab 05/28/18 1559 05/29/18 0358  NA 138 141  K 3.8 3.1*  CL 105 104  CO2 24 29  GLUCOSE 110* 110*  BUN 19 15  CREATININE 0.93 0.78  CALCIUM 9.2 8.5*   GFR: Estimated Creatinine Clearance: 118.2 mL/min (by C-G formula based on SCr of 0.78 mg/dL). Liver Function Tests: Recent Labs  Lab 05/28/18 1559 05/29/18 0358  AST 16 10*  ALT 13 9  ALKPHOS 102 80  BILITOT  0.8 0.6  PROT 8.9* 7.0  ALBUMIN 3.9 3.0*   Recent Labs  Lab 05/28/18 1559  LIPASE 20   No results for input(s): AMMONIA in the last 168 hours. Coagulation Profile: No results for input(s): INR, PROTIME in the last 168 hours. Cardiac Enzymes: No results for input(s): CKTOTAL, CKMB, CKMBINDEX, TROPONINI in the last 168 hours. BNP (last 3 results) No results for input(s): PROBNP in the last 8760 hours. HbA1C: No results for input(s): HGBA1C in the last 72 hours. CBG: Recent Labs  Lab 05/29/18 0754  GLUCAP 104*   Lipid Profile: No results for input(s): CHOL, HDL, LDLCALC, TRIG, CHOLHDL, LDLDIRECT in the  last 72 hours. Thyroid Function Tests: No results for input(s): TSH, T4TOTAL, FREET4, T3FREE, THYROIDAB in the last 72 hours. Anemia Panel: No results for input(s): VITAMINB12, FOLATE, FERRITIN, TIBC, IRON, RETICCTPCT in the last 72 hours. Sepsis Labs: No results for input(s): PROCALCITON, LATICACIDVEN in the last 168 hours.  No results found for this or any previous visit (from the past 240 hour(s)).       Radiology Studies: Ct Abdomen Pelvis W Contrast  Result Date: 05/28/2018 CLINICAL DATA:  Patient started vomiting yesterday with difficulty keeping liquids down. EXAM: CT ABDOMEN AND PELVIS WITH CONTRAST TECHNIQUE: Multidetector CT imaging of the abdomen and pelvis was performed using the standard protocol following bolus administration of intravenous contrast. CONTRAST:  123m OMNIPAQUE IOHEXOL 300 MG/ML  SOLN COMPARISON:  None. FINDINGS: Lower chest: Borderline cardiomegaly without pericardial effusion. Atelectasis is seen at the lung bases. No significant pleural effusion or pneumothorax. Hepatobiliary: Uncomplicated cholelithiasis. No secondary signs of acute cholecystitis. No liver lesion or biliary dilatation. Pancreas: No inflammation, pathologic ductal dilatation or mass. Spleen: Normal Adrenals/Urinary Tract: Normal bilateral adrenal glands, kidneys and ureters. The urinary bladder is unremarkable for the degree of distention. Stomach/Bowel: High-grade early or partial SBO secondary to a retractile masslike abnormality in the mid pelvis that may represent carcinoid or stigmata of advanced inflammatory bowel disease. Dilated fluid filled jejunal loops are identified measuring up to 4 cm. Adjacent ileal loops are decompressed but tethered in appearance due to this abnormality and there is mucosal enhancement of nondistended distal and terminal ileum with mural thickening. A small amount of fluid is seen within the colon without pathologic dilatation of the colon. The appendix is not  confidently identified. Vascular/Lymphatic: Small mesenteric lymph nodes are noted. Reproductive: Mild enlargement of the prostate measuring 6.2 x 4.1 x 4 cm. Other: No free air nor free fluid. Small periumbilical fat containing hernia. Musculoskeletal: Sclerosis of the sacroiliac joints bilaterally with smooth flowing syndesmophytes. No acute fracture nor suspicious osseous lesions. IMPRESSION: 1. High-grade partial small-bowel obstruction due to a mesenteric masslike abnormality in the mid pelvis that may reflect inflammatory tissue potentially from prior bouts of inflammatory bowel disease. This results in retraction and tethering of adjacent ileal loops with dilatation of more proximal jejunal loops up to 4 cm. Differential possibility may also include the possibility of midgut carcinoid tumor. Given findings of sacroiliitis and smooth syndesmophyte formation, stigmata of inflammatory bowel disease and an HLA B27 disorder is favored. An Indium 111 study if evaluation for carcinoid is necessary could be performed from an imaging standpoint. 2. Prostatomegaly. 3. Uncomplicated cholelithiasis. Electronically Signed   By: DAshley RoyaltyM.D.   On: 05/28/2018 19:18        Scheduled Meds: Continuous Infusions:  dextrose 5 % and 0.9% NaCl 100 mL/hr at 05/29/18 0647   potassium chloride 10 mEq (05/29/18 1123)  LOS: 1 day    Time spent: 35 minutes     Elmarie Shiley, MD [facility] Pager [phone]  If 7PM-7AM, please contact night-coverage [email or web address] Password [facility] 05/29/2018, 11:57 AM

## 2018-05-30 ENCOUNTER — Inpatient Hospital Stay (HOSPITAL_COMMUNITY): Payer: Medicaid Other

## 2018-05-30 LAB — GLUCOSE, CAPILLARY
Glucose-Capillary: 76 mg/dL (ref 70–99)
Glucose-Capillary: 82 mg/dL (ref 70–99)
Glucose-Capillary: 82 mg/dL (ref 70–99)
Glucose-Capillary: 92 mg/dL (ref 70–99)

## 2018-05-30 LAB — CBC
HCT: 34 % — ABNORMAL LOW (ref 39.0–52.0)
Hemoglobin: 10.5 g/dL — ABNORMAL LOW (ref 13.0–17.0)
MCH: 22.4 pg — ABNORMAL LOW (ref 26.0–34.0)
MCHC: 30.9 g/dL (ref 30.0–36.0)
MCV: 72.6 fL — ABNORMAL LOW (ref 80.0–100.0)
NRBC: 0 % (ref 0.0–0.2)
Platelets: 351 10*3/uL (ref 150–400)
RBC: 4.68 MIL/uL (ref 4.22–5.81)
RDW: 19.5 % — AB (ref 11.5–15.5)
WBC: 5.4 10*3/uL (ref 4.0–10.5)

## 2018-05-30 LAB — BASIC METABOLIC PANEL
ANION GAP: 8 (ref 5–15)
BUN: 7 mg/dL (ref 6–20)
CALCIUM: 8.6 mg/dL — AB (ref 8.9–10.3)
CO2: 24 mmol/L (ref 22–32)
Chloride: 106 mmol/L (ref 98–111)
Creatinine, Ser: 0.74 mg/dL (ref 0.61–1.24)
GFR calc Af Amer: >60 mL/min (ref >60)
Glucose, Bld: 99 mg/dL (ref 70–99)
Potassium: 3.3 mmol/L — ABNORMAL LOW (ref 3.5–5.1)
Sodium: 138 mmol/L (ref 135–145)

## 2018-05-30 MED ORDER — POTASSIUM CHLORIDE CRYS ER 20 MEQ PO TBCR
20.0000 meq | EXTENDED_RELEASE_TABLET | Freq: Two times a day (BID) | ORAL | Status: DC
  Administered 2018-05-30 – 2018-05-31 (×3): 20 meq via ORAL
  Filled 2018-05-30 (×3): qty 1

## 2018-05-30 NOTE — Progress Notes (Signed)
Subjective: Abdominal discomfort improving. Wants to go home  Objective: Vital signs in last 24 hours: Temp:  [98.2 F (36.8 C)-98.9 F (37.2 C)] 98.2 F (36.8 C) (03/14 0637) Pulse Rate:  [71-81] 71 (03/14 0637) Resp:  [17] 17 (03/14 0637) BP: (93-125)/(65-83) 93/65 (03/14 0637) SpO2:  [97 %-100 %] 97 % (03/14 0637) Weight:  [74.4 kg] 74.4 kg (03/14 0637) Weight change: 1.79 kg Last BM Date: 05/29/18  PE: GEN:  NAD ABD:  Distended, tympanic; no peritonitis; bowel sounds noted  Lab Results: CBC    Component Value Date/Time   WBC 5.4 05/30/2018 0335   RBC 4.68 05/30/2018 0335   HGB 10.5 (L) 05/30/2018 0335   HCT 34.0 (L) 05/30/2018 0335   PLT 351 05/30/2018 0335   MCV 72.6 (L) 05/30/2018 0335   MCH 22.4 (L) 05/30/2018 0335   MCHC 30.9 05/30/2018 0335   RDW 19.5 (H) 05/30/2018 0335   LYMPHSABS 1.5 07/11/2016 1922   MONOABS 0.4 07/11/2016 1922   EOSABS 0.1 07/11/2016 1922   BASOSABS 0.0 07/11/2016 1922   CMP     Component Value Date/Time   NA 138 05/30/2018 0335   K 3.3 (L) 05/30/2018 0335   CL 106 05/30/2018 0335   CO2 24 05/30/2018 0335   GLUCOSE 99 05/30/2018 0335   BUN 7 05/30/2018 0335   CREATININE 0.74 05/30/2018 0335   CALCIUM 8.6 (L) 05/30/2018 0335   PROT 7.0 05/29/2018 0358   ALBUMIN 3.0 (L) 05/29/2018 0358   AST 10 (L) 05/29/2018 0358   ALT 9 05/29/2018 0358   ALKPHOS 80 05/29/2018 0358   BILITOT 0.6 05/29/2018 0358   GFRNONAA >60 05/30/2018 0335   GFRAA >60 05/30/2018 0335   ABD Xray (I reviewed):  Improving degree of small bowel dilatation  Assessment:  1.  Small bowel obstruction, improving, unclear etiology. 2.  Mesenteric inflammatory lesion mid-gut.  Sclerosing mesenteritis, IBD (doubt), carcinoid, other.  Plan:  1.  Unfortunately lesion on CT not amenable or approachable endoscopically. 2.  Suspect laparoscopy with biopsy of mesenteric lesion is only way to find out what's going on. 3.  I have discussed with surgical team. 4.   Unfortunately, nothing else to offer from GI perspective; we will follow along at a distance; please call us back with any further questions.   Landry Dyke 05/30/2018, 11:21 AM   Cell [phone] If no answer or after 5 PM call [phone]

## 2018-05-30 NOTE — Progress Notes (Signed)
PROGRESS NOTE    Tyler Dominguez  YHC:[id number] DOB: 21-Dec-1980 DOA: 05/28/2018 PCP: Nolene Ebbs, MD    Brief Narrative:  38 year old with past medical history significant for tobacco abuse, bipolar who presents to the emergency department complaining of nausea vomiting for 24 hours prior to admission.  CT abdomen showed small bowel obstruction high-grade, due to mesenteric like mass abnormality.  General surgery has been consulted as well as GI.    Assessment & Plan:   Principal Problem:   SBO (small bowel obstruction) (HCC) Active Problems:   Mood disorder (HCC)   Small bowel obstruction (HCC)   1-High-grade small bowel obstruction: Differential includes inflammatory bowel disease and possible mass including carcinoid. General surgery and GI consulted. NG tube was removed today.  Had bowel movement today. And to start clear diet today.  If he fail he will need surgery next week. GI is recommending laparoscopic for biopsy of possible mass.  Less likely inflammatory bowel disease  2-Hypokalemia; replete with IV KCl.  3-Mood disorder: Hold medications due to n.p.o. status  4-anemia: Hemoglobin trending down.  Check anemia panel.   Estimated body mass index is 25.69 kg/m as calculated from the following:   Height as of this encounter: '5\' 7"'  (1.702 m).   Weight as of this encounter: 74.4 kg.   DVT prophylaxis: SCDs Code Status: Full code Family Communication: Mother who was at bedside Disposition Plan; made in the hospital for treatment of a small bowel obstruction  Consultants:   General surgery  GI   Procedures:   None   Antimicrobials:  None   Subjective: Mother at bedside. Patient reports improvement of abdominal pain.  He had a bowel movement.  He wants to eat he wants to go home.  He has poor insight    Objective: Vitals:   05/29/18 1726 05/29/18 2019 05/30/18 0637 05/30/18 1424  BP: 123/78 125/83 93/65 118/84  Pulse: 81 78 71 73  Resp:  '17 17 17 16  ' Temp: 98.9 F (37.2 C) 98.8 F (37.1 C) 98.2 F (36.8 C) 98.4 F (36.9 C)  TempSrc: Oral Oral Oral Oral  SpO2: 99% 100% 97% 100%  Weight:   74.4 kg   Height:        Intake/Output Summary (Last 24 hours) at 05/30/2018 1451 Last data filed at 05/30/2018 0451 Gross per 24 hour  Intake 1302.26 ml  Output --  Net 1302.26 ml   Filed Weights   05/28/18 2211 05/29/18 0500 05/30/18 0637  Weight: 72.6 kg 73.7 kg 74.4 kg    Examination:  General exam: Not acute distress Respiratory system: Clear to auscultation Cardiovascular system: S1, S2 regular rhythm and rate Gastrointestinal system: Bowel sounds present, distended mild tender Central nervous system: Alert and oriented Extremities: Symmetric power Skin: No rashes   Data Reviewed: I have personally reviewed following labs and imaging studies  CBC: Recent Labs  Lab 05/28/18 1559 05/29/18 0358 05/30/18 0335  WBC 6.5 4.8 5.4  HGB 13.1 11.0* 10.5*  HCT 44.0 36.6* 34.0*  MCV 72.1* 71.6* 72.6*  PLT 441* 373 517   Basic Metabolic Panel: Recent Labs  Lab 05/28/18 1559 05/29/18 0358 05/30/18 0335  NA 138 141 138  K 3.8 3.1* 3.3*  CL 105 104 106  CO2 '24 29 24  ' GLUCOSE 110* 110* 99  BUN '19 15 7  ' CREATININE 0.93 0.78 0.74  CALCIUM 9.2 8.5* 8.6*   GFR: Estimated Creatinine Clearance: 118.2 mL/min (by C-G formula based on SCr of 0.74 mg/dL). Liver  Function Tests: Recent Labs  Lab 05/28/18 1559 05/29/18 0358  AST 16 10*  ALT 13 9  ALKPHOS 102 80  BILITOT 0.8 0.6  PROT 8.9* 7.0  ALBUMIN 3.9 3.0*   Recent Labs  Lab 05/28/18 1559  LIPASE 20   No results for input(s): AMMONIA in the last 168 hours. Coagulation Profile: No results for input(s): INR, PROTIME in the last 168 hours. Cardiac Enzymes: No results for input(s): CKTOTAL, CKMB, CKMBINDEX, TROPONINI in the last 168 hours. BNP (last 3 results) No results for input(s): PROBNP in the last 8760 hours. HbA1C: No results for input(s): HGBA1C  in the last 72 hours. CBG: Recent Labs  Lab 05/29/18 0754 05/29/18 1741 05/30/18 0001 05/30/18 0752 05/30/18 1159  GLUCAP 104* 105* 92 76 82   Lipid Profile: No results for input(s): CHOL, HDL, LDLCALC, TRIG, CHOLHDL, LDLDIRECT in the last 72 hours. Thyroid Function Tests: No results for input(s): TSH, T4TOTAL, FREET4, T3FREE, THYROIDAB in the last 72 hours. Anemia Panel: No results for input(s): VITAMINB12, FOLATE, FERRITIN, TIBC, IRON, RETICCTPCT in the last 72 hours. Sepsis Labs: No results for input(s): PROCALCITON, LATICACIDVEN in the last 168 hours.  No results found for this or any previous visit (from the past 240 hour(s)).       Radiology Studies: Ct Abdomen Pelvis W Contrast  Result Date: 05/28/2018 CLINICAL DATA:  Patient started vomiting yesterday with difficulty keeping liquids down. EXAM: CT ABDOMEN AND PELVIS WITH CONTRAST TECHNIQUE: Multidetector CT imaging of the abdomen and pelvis was performed using the standard protocol following bolus administration of intravenous contrast. CONTRAST:  158m OMNIPAQUE IOHEXOL 300 MG/ML  SOLN COMPARISON:  None. FINDINGS: Lower chest: Borderline cardiomegaly without pericardial effusion. Atelectasis is seen at the lung bases. No significant pleural effusion or pneumothorax. Hepatobiliary: Uncomplicated cholelithiasis. No secondary signs of acute cholecystitis. No liver lesion or biliary dilatation. Pancreas: No inflammation, pathologic ductal dilatation or mass. Spleen: Normal Adrenals/Urinary Tract: Normal bilateral adrenal glands, kidneys and ureters. The urinary bladder is unremarkable for the degree of distention. Stomach/Bowel: High-grade early or partial SBO secondary to a retractile masslike abnormality in the mid pelvis that may represent carcinoid or stigmata of advanced inflammatory bowel disease. Dilated fluid filled jejunal loops are identified measuring up to 4 cm. Adjacent ileal loops are decompressed but tethered in  appearance due to this abnormality and there is mucosal enhancement of nondistended distal and terminal ileum with mural thickening. A small amount of fluid is seen within the colon without pathologic dilatation of the colon. The appendix is not confidently identified. Vascular/Lymphatic: Small mesenteric lymph nodes are noted. Reproductive: Mild enlargement of the prostate measuring 6.2 x 4.1 x 4 cm. Other: No free air nor free fluid. Small periumbilical fat containing hernia. Musculoskeletal: Sclerosis of the sacroiliac joints bilaterally with smooth flowing syndesmophytes. No acute fracture nor suspicious osseous lesions. IMPRESSION: 1. High-grade partial small-bowel obstruction due to a mesenteric masslike abnormality in the mid pelvis that may reflect inflammatory tissue potentially from prior bouts of inflammatory bowel disease. This results in retraction and tethering of adjacent ileal loops with dilatation of more proximal jejunal loops up to 4 cm. Differential possibility may also include the possibility of midgut carcinoid tumor. Given findings of sacroiliitis and smooth syndesmophyte formation, stigmata of inflammatory bowel disease and an HLA B27 disorder is favored. An Indium 111 study if evaluation for carcinoid is necessary could be performed from an imaging standpoint. 2. Prostatomegaly. 3. Uncomplicated cholelithiasis. Electronically Signed   By: DMeredith LeedsD.  On: 05/28/2018 19:18   Dg Abd Portable 1v  Result Date: 05/30/2018 CLINICAL DATA:  Small bowel obstruction follow-up EXAM: PORTABLE ABDOMEN - 1 VIEW COMPARISON:  05/29/2018 radiograph and 05/28/2018 CT FINDINGS: Mild gaseous distension of small bowel small bowel has slightly decreased. Small amount of colonic gas is again noted. No other significant change. IMPRESSION: Slightly decreased mild gaseous distension of small bowel loops. Electronically Signed   By: Margarette Canada M.D.   On: 05/30/2018 08:26   Dg Abd Portable 1v  Result  Date: 05/29/2018 CLINICAL DATA:  38 year old with past medical history significant for tobacco abuse, remote disorder who presents to the emergency department complaining of nausea vomiting for 24 hours prior to admission. CT abdomen showed small bowel obstruct.*comment was truncated* EXAM: PORTABLE ABDOMEN - 1 VIEW COMPARISON:  CT 05/28/2018 FINDINGS: Persistent dilated loops of small bowel in the mid abdomen measuring 3.7 cm compared to 4.7 cm on CT topogram. NG tube tip is seen in the proximal stomach. Small amount gas the rectum. IMPRESSION: Persistent small bowel obstruction pattern. NG tube with tip in the stomach. Small volume gas in the rectum. Electronically Signed   By: Suzy Bouchard M.D.   On: 05/29/2018 12:45        Scheduled Meds:  potassium chloride  20 mEq Oral BID   Continuous Infusions:  dextrose 5 % and 0.9% NaCl 100 mL/hr at 05/30/18 1208     LOS: 2 days    Time spent: 35 minutes     Elmarie Shiley, MD [facility] Pager [phone]  If 7PM-7AM, please contact night-coverage [email or web address] Password [facility] 05/30/2018, 2:51 PM
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
## 2018-05-31 ENCOUNTER — Inpatient Hospital Stay (HOSPITAL_COMMUNITY): Payer: Medicaid Other

## 2018-05-31 LAB — IRON AND TIBC
Iron: 24 ug/dL — ABNORMAL LOW (ref 45–182)
SATURATION RATIOS: 8 % — AB (ref 17.9–39.5)
TIBC: 315 ug/dL (ref 250–450)
UIBC: 291 ug/dL

## 2018-05-31 LAB — RETICULOCYTES
Immature Retic Fract: 22.8 % — ABNORMAL HIGH (ref 2.3–15.9)
RBC.: 4.74 MIL/uL (ref 4.22–5.81)
Retic Count, Absolute: 47.9 10*3/uL (ref 19.0–186.0)
Retic Ct Pct: 1 % (ref 0.4–3.1)

## 2018-05-31 LAB — BASIC METABOLIC PANEL
Anion gap: 6 (ref 5–15)
BUN: <5 mg/dL — ABNORMAL LOW (ref 6–20)
CO2: 24 mmol/L (ref 22–32)
Calcium: 8.3 mg/dL — ABNORMAL LOW (ref 8.9–10.3)
Chloride: 108 mmol/L (ref 98–111)
Creatinine, Ser: 0.63 mg/dL (ref 0.61–1.24)
GFR calc non Af Amer: >60 mL/min (ref >60)
Glucose, Bld: 92 mg/dL (ref 70–99)
Potassium: 3.3 mmol/L — ABNORMAL LOW (ref 3.5–5.1)
Sodium: 138 mmol/L (ref 135–145)

## 2018-05-31 LAB — CBC
HCT: 34.2 % — ABNORMAL LOW (ref 39.0–52.0)
Hemoglobin: 10.1 g/dL — ABNORMAL LOW (ref 13.0–17.0)
MCH: 21.3 pg — ABNORMAL LOW (ref 26.0–34.0)
MCHC: 29.5 g/dL — ABNORMAL LOW (ref 30.0–36.0)
MCV: 72.2 fL — ABNORMAL LOW (ref 80.0–100.0)
Platelets: 356 10*3/uL (ref 150–400)
RBC: 4.74 MIL/uL (ref 4.22–5.81)
RDW: 18.9 % — ABNORMAL HIGH (ref 11.5–15.5)
WBC: 4.4 10*3/uL (ref 4.0–10.5)
nRBC: 0 % (ref 0.0–0.2)

## 2018-05-31 LAB — FOLATE: Folate: 16.2 ng/mL (ref >5.9)

## 2018-05-31 LAB — VITAMIN B12: Vitamin B-12: 615 pg/mL (ref 180–914)

## 2018-05-31 LAB — GLUCOSE, CAPILLARY
Glucose-Capillary: 83 mg/dL (ref 70–99)
Glucose-Capillary: 69 mg/dL — ABNORMAL LOW (ref 70–99)
Glucose-Capillary: 76 mg/dL (ref 70–99)

## 2018-05-31 LAB — FERRITIN: FERRITIN: 25 ng/mL (ref 24–336)

## 2018-05-31 MED ORDER — POTASSIUM CHLORIDE CRYS ER 20 MEQ PO TBCR
40.0000 meq | EXTENDED_RELEASE_TABLET | Freq: Once | ORAL | Status: AC
  Administered 2018-05-31: 40 meq via ORAL
  Filled 2018-05-31: qty 2

## 2018-05-31 MED ORDER — AMITRIPTYLINE HCL 50 MG PO TABS
50.0000 mg | ORAL_TABLET | Freq: Every evening | ORAL | Status: DC | PRN
  Filled 2018-05-31: qty 1

## 2018-05-31 MED ORDER — KCL IN DEXTROSE-NACL 20-5-0.9 MEQ/L-%-% IV SOLN
INTRAVENOUS | Status: DC
  Administered 2018-05-31 – 2018-06-02 (×4): via INTRAVENOUS
  Filled 2018-05-31 (×5): qty 1000

## 2018-05-31 MED ORDER — POTASSIUM CHLORIDE 2 MEQ/ML IV SOLN: INTRAVENOUS | Status: DC

## 2018-05-31 MED ORDER — SODIUM CHLORIDE 0.9 % IV SOLN
510.0000 mg | Freq: Once | INTRAVENOUS | Status: AC
  Administered 2018-05-31: 510 mg via INTRAVENOUS
  Filled 2018-05-31: qty 17

## 2018-05-31 MED ORDER — POTASSIUM CHLORIDE 10 MEQ/100ML IV SOLN
10.0000 meq | INTRAVENOUS | Status: DC
  Administered 2018-05-31 (×3): 10 meq via INTRAVENOUS
  Filled 2018-05-31 (×3): qty 100

## 2018-05-31 NOTE — Progress Notes (Signed)
PROGRESS NOTE    Tyler Dominguez  PIR:[id number] DOB: 1980/05/19 DOA: 05/28/2018 PCP: Nolene Ebbs, MD    Brief Narrative:  38 year old with past medical history significant for tobacco abuse, bipolar who presents to the emergency department complaining of nausea vomiting for 24 hours prior to admission.  CT abdomen showed small bowel obstruction high-grade, due to mesenteric like mass abnormality.  General surgery has been consulted as well as GI.    Assessment & Plan:   Principal Problem:   SBO (small bowel obstruction) (HCC) Active Problems:   Mood disorder (HCC)   Small bowel obstruction (HCC)   1-High-grade small bowel obstruction: Differential includes inflammatory bowel disease and possible mass including carcinoid. General surgery and GI consulted. NG tube was removed today.  Having BM.  GI is recommending laparoscopic for biopsy of possible mass.  Less likely inflammatory bowel disease Surgery recommend laparoscopy biopsy this admission, the surgeon  for the week need to define time. Discussed with Dr Ninfa Linden.  Also, surgery should speak with patient's mother regarding surgery intervation. She helps her son with medical decision making, she help him managing his bills. We might need to get psych evaluation.   2-Hypokalemia; replete with IV fluids.  Replete orally.   3-Mood disorder: he gets Abilify  injection at [facility] every month.  Resume elavil.   4-Anemia: Hemoglobin trending down.  Check anemia panel. Iron deficiency anemia.  Will give IV iron.    Estimated body mass index is 25.69 kg/m as calculated from the following:   Height as of this encounter: 5\' 7"  (1.702 m).   Weight as of this encounter: 74.4 kg.   DVT prophylaxis: SCDs Code Status: Full code Family Communication: Mother who was at bedside Disposition Plan; made in the hospital for treatment of a small bowel obstruction  Consultants:   General surgery  GI   Procedures:   None    Antimicrobials:  None   Subjective: He is complaining of burning in the IV site.  He denies abdominal pain.      Objective: Vitals:   05/30/18 0637 05/30/18 1424 05/30/18 2044 05/31/18 0438  BP: 93/65 118/84 (!) 131/97 123/89  Pulse: 71 73 69 77  Resp: 17 16 18 18   Temp: 98.2 F (36.8 C) 98.4 F (36.9 C) 98.3 F (36.8 C) 97.8 F (36.6 C)  TempSrc: Oral Oral Oral Oral  SpO2: 97% 100% 100% 99%  Weight: 74.4 kg     Height:        Intake/Output Summary (Last 24 hours) at 05/31/2018 1320 Last data filed at 05/31/2018 1016 Gross per 24 hour  Intake 2020.89 ml  Output -  Net 2020.89 ml   Filed Weights   05/28/18 2211 05/29/18 0500 05/30/18 0637  Weight: 72.6 kg 73.7 kg 74.4 kg    Examination:  General exam: NAD Respiratory system: CTA Cardiovascular system: S 1, S 2 RRR Gastrointestinal system: BS present, soft, nt, mild distended Central nervous system; Alert, non focal.  Extremities: symmetric power.  Skin: No rashes   Data Reviewed: I have personally reviewed following labs and imaging studies  CBC: Recent Labs  Lab 05/28/18 1559 05/29/18 0358 05/30/18 0335 05/31/18 0424  WBC 6.5 4.8 5.4 4.4  HGB 13.1 11.0* 10.5* 10.1*  HCT 44.0 36.6* 34.0* 34.2*  MCV 72.1* 71.6* 72.6* 72.2*  PLT 441* 373 351 630   Basic Metabolic Panel: Recent Labs  Lab 05/28/18 1559 05/29/18 0358 05/30/18 0335 05/31/18 0424  NA 138 141 138 138  K 3.8  3.1* 3.3* 3.3*  CL 105 104 106 108  CO2 24 29 24 24   GLUCOSE 110* 110* 99 92  BUN 19 15 7  <5*  CREATININE 0.93 0.78 0.74 0.63  CALCIUM 9.2 8.5* 8.6* 8.3*   GFR: Estimated Creatinine Clearance: 118.2 mL/min (by C-G formula based on SCr of 0.63 mg/dL). Liver Function Tests: Recent Labs  Lab 05/28/18 1559 05/29/18 0358  AST 16 10*  ALT 13 9  ALKPHOS 102 80  BILITOT 0.8 0.6  PROT 8.9* 7.0  ALBUMIN 3.9 3.0*   Recent Labs  Lab 05/28/18 1559  LIPASE 20   No results for input(s): AMMONIA in the last 168 hours.  Coagulation Profile: No results for input(s): INR, PROTIME in the last 168 hours. Cardiac Enzymes: No results for input(s): CKTOTAL, CKMB, CKMBINDEX, TROPONINI in the last 168 hours. BNP (last 3 results) No results for input(s): PROBNP in the last 8760 hours. HbA1C: No results for input(s): HGBA1C in the last 72 hours. CBG: Recent Labs  Lab 05/30/18 0752 05/30/18 1159 05/30/18 2021 05/31/18 0436 05/31/18 1220  GLUCAP 76 82 82 83 69*   Lipid Profile: No results for input(s): CHOL, HDL, LDLCALC, TRIG, CHOLHDL, LDLDIRECT in the last 72 hours. Thyroid Function Tests: No results for input(s): TSH, T4TOTAL, FREET4, T3FREE, THYROIDAB in the last 72 hours. Anemia Panel: Recent Labs    05/31/18 0424  VITAMINB12 615  FOLATE 16.2  FERRITIN 25  TIBC 315  IRON 24*  RETICCTPCT 1.0   Sepsis Labs: No results for input(s): PROCALCITON, LATICACIDVEN in the last 168 hours.  No results found for this or any previous visit (from the past 240 hour(s)).       Radiology Studies: Dg Abd Portable 1v  Result Date: 05/31/2018 CLINICAL DATA:  Small bowel obstruction EXAM: PORTABLE ABDOMEN - 1 VIEW COMPARISON:  05/30/2018 FINDINGS: Left lower lobe opacity, atelectasis versus pneumonia. Nonobstructive bowel gas pattern. Gas within the colon, which is not decompressed. No dilated small bowel centrally. Degenerative changes of the bilateral hips and lower lumbar spine. IMPRESSION: No evidence of bowel obstruction. Left lower lobe opacity, atelectasis versus pneumonia. Electronically Signed   By: Julian Hy M.D.   On: 05/31/2018 08:38   Dg Abd Portable 1v  Result Date: 05/30/2018 CLINICAL DATA:  Small bowel obstruction follow-up EXAM: PORTABLE ABDOMEN - 1 VIEW COMPARISON:  05/29/2018 radiograph and 05/28/2018 CT FINDINGS: Mild gaseous distension of small bowel small bowel has slightly decreased. Small amount of colonic gas is again noted. No other significant change. IMPRESSION: Slightly  decreased mild gaseous distension of small bowel loops. Electronically Signed   By: Margarette Canada M.D.   On: 05/30/2018 08:26        Scheduled Meds:  Continuous Infusions: . dextrose 5 % and 0.9 % NaCl with KCl 20 mEq/L 100 mL/hr at 05/31/18 1108     LOS: 3 days    Time spent: 35 minutes     Elmarie Shiley, MD [facility] Pager [phone]  If 7PM-7AM, please contact night-coverage [email or web address] Password [email or web address] 05/31/2018, 1:20 PM
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
## 2018-06-01 LAB — GLUCOSE, CAPILLARY
GLUCOSE-CAPILLARY: 80 mg/dL (ref 70–99)
Glucose-Capillary: 87 mg/dL (ref 70–99)
Glucose-Capillary: 85 mg/dL (ref 70–99)
Glucose-Capillary: 107 mg/dL — ABNORMAL HIGH (ref 70–99)

## 2018-06-01 LAB — CBC
HCT: 35.4 % — ABNORMAL LOW (ref 39.0–52.0)
Hemoglobin: 10.7 g/dL — ABNORMAL LOW (ref 13.0–17.0)
MCH: 21.6 pg — ABNORMAL LOW (ref 26.0–34.0)
MCHC: 30.2 g/dL (ref 30.0–36.0)
MCV: 71.5 fL — ABNORMAL LOW (ref 80.0–100.0)
Platelets: 390 10*3/uL (ref 150–400)
RBC: 4.95 MIL/uL (ref 4.22–5.81)
RDW: 18.7 % — ABNORMAL HIGH (ref 11.5–15.5)
WBC: 4.6 10*3/uL (ref 4.0–10.5)
nRBC: 0 % (ref 0.0–0.2)

## 2018-06-01 LAB — COMPREHENSIVE METABOLIC PANEL
ALT: 8 U/L (ref 0–44)
ANION GAP: 7 (ref 5–15)
AST: 9 U/L — ABNORMAL LOW (ref 15–41)
Albumin: 2.9 g/dL — ABNORMAL LOW (ref 3.5–5.0)
Alkaline Phosphatase: 81 U/L (ref 38–126)
BUN: <5 mg/dL — ABNORMAL LOW (ref 6–20)
CO2: 25 mmol/L (ref 22–32)
Calcium: 8.6 mg/dL — ABNORMAL LOW (ref 8.9–10.3)
Chloride: 104 mmol/L (ref 98–111)
Creatinine, Ser: 0.64 mg/dL (ref 0.61–1.24)
GFR calc non Af Amer: >60 mL/min (ref >60)
Glucose, Bld: 102 mg/dL — ABNORMAL HIGH (ref 70–99)
Potassium: 3.3 mmol/L — ABNORMAL LOW (ref 3.5–5.1)
SODIUM: 136 mmol/L (ref 135–145)
Total Bilirubin: 0.6 mg/dL (ref 0.3–1.2)
Total Protein: 6.6 g/dL (ref 6.5–8.1)

## 2018-06-01 MED ORDER — POTASSIUM CHLORIDE CRYS ER 20 MEQ PO TBCR
40.0000 meq | EXTENDED_RELEASE_TABLET | Freq: Once | ORAL | Status: AC
  Administered 2018-06-01: 40 meq via ORAL
  Filled 2018-06-01: qty 2
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
## 2018-06-01 NOTE — Progress Notes (Signed)
PROGRESS NOTE    Tyler Dominguez  OXB:[id number] DOB: 04/24/1980 DOA: 05/28/2018 PCP: Nolene Ebbs, MD    Brief Narrative:  38 year old with past medical history significant for tobacco abuse, bipolar who presents to the emergency department complaining of nausea vomiting for 24 hours prior to admission.  CT abdomen showed small bowel obstruction high-grade, due to mesenteric like mass abnormality.  General surgery has been consulted as well as GI.    Assessment & Plan:   Principal Problem:   SBO (small bowel obstruction) (HCC) Active Problems:   Mood disorder (HCC)   Small bowel obstruction (HCC)   1-High-grade small bowel obstruction: Differential includes inflammatory bowel disease and possible mass including carcinoid. General surgery and GI consulted. NG tube was removed today.  Having BM.  GI is recommending laparoscopic for biopsy of possible mass.  Less likely inflammatory bowel disease Patient is refusing laparoscopic for biopsy.  Plan was to get octreotide scan , but radiology not longer perform this scan. He will need PET scan, need to be done out patient.  I will order 5 HIAA and Chromogranin.  Observed  overnight on soft diet.   2-Hypokalemia; replete with IV fluids.  Replete orally.   3-Mood disorder: he gets Abilify  injection at [facility] every month.  Resume elavil.   4-Anemia: Hemoglobin trending down.  Check anemia panel. Iron deficiency anemia.  Received IV iron.    Estimated body mass index is 25.69 kg/m as calculated from the following:   Height as of this encounter: 5\' 7"  (1.702 m).   Weight as of this encounter: 74.4 kg.   DVT prophylaxis: SCDs Code Status: Full code Family Communication: Mother who was at bedside Disposition Plan; made in the hospital for treatment of a small bowel obstruction  Consultants:   General surgery  GI   Procedures:   None   Antimicrobials:  None   Subjective: He denies  abdominal pain     Objective: Vitals:   05/31/18 1433 05/31/18 2045 06/01/18 0439 06/01/18 1427  BP: 126/90 128/78 124/80 129/85  Pulse: 75 78 80 78  Resp: 18 18 18 18   Temp: 98.2 F (36.8 C) 98.1 F (36.7 C) 98 F (36.7 C) 98.6 F (37 C)  TempSrc: Oral Oral Oral Oral  SpO2: 100% 99% 100% 97%  Weight:      Height:        Intake/Output Summary (Last 24 hours) at 06/01/2018 1619 Last data filed at 06/01/2018 6834 Gross per 24 hour  Intake 1703 ml  Output -  Net 1703 ml   Filed Weights   05/28/18 2211 05/29/18 0500 05/30/18 1962  Weight: 72.6 kg 73.7 kg 74.4 kg    Examination:  General exam: NAD Respiratory system: CTA Cardiovascular system: S 1, S 2 RRR Gastrointestinal system: BS present, soft, nt Central nervous system;Alert, non focal.  Extremities: Symmetric power.  Skin: No rashes   Data Reviewed: I have personally reviewed following labs and imaging studies  CBC: Recent Labs  Lab 05/28/18 1559 05/29/18 0358 05/30/18 0335 05/31/18 0424 06/01/18 0344  WBC 6.5 4.8 5.4 4.4 4.6  HGB 13.1 11.0* 10.5* 10.1* 10.7*  HCT 44.0 36.6* 34.0* 34.2* 35.4*  MCV 72.1* 71.6* 72.6* 72.2* 71.5*  PLT 441* 373 351 356 229   Basic Metabolic Panel: Recent Labs  Lab 05/28/18 1559 05/29/18 0358 05/30/18 0335 05/31/18 0424 06/01/18 0344  NA 138 141 138 138 136  K 3.8 3.1* 3.3* 3.3* 3.3*  CL 105 104 106 108 104  CO2 24 29 24 24 25   GLUCOSE 110* 110* 99 92 102*  BUN 19 15 7  <5* <5*  CREATININE 0.93 0.78 0.74 0.63 0.64  CALCIUM 9.2 8.5* 8.6* 8.3* 8.6*   GFR: Estimated Creatinine Clearance: 118.2 mL/min (by C-G formula based on SCr of 0.64 mg/dL). Liver Function Tests: Recent Labs  Lab 05/28/18 1559 05/29/18 0358 06/01/18 0344  AST 16 10* 9*  ALT 13 9 8   ALKPHOS 102 80 81  BILITOT 0.8 0.6 0.6  PROT 8.9* 7.0 6.6  ALBUMIN 3.9 3.0* 2.9*   Recent Labs  Lab 05/28/18 1559  LIPASE 20   No results for input(s): AMMONIA in the last 168 hours. Coagulation Profile: No results for  input(s): INR, PROTIME in the last 168 hours. Cardiac Enzymes: No results for input(s): CKTOTAL, CKMB, CKMBINDEX, TROPONINI in the last 168 hours. BNP (last 3 results) No results for input(s): PROBNP in the last 8760 hours. HbA1C: No results for input(s): HGBA1C in the last 72 hours. CBG: Recent Labs  Lab 05/31/18 1220 05/31/18 2038 06/01/18 0436 06/01/18 0809 06/01/18 1228  GLUCAP 69* 76 87 85 80   Lipid Profile: No results for input(s): CHOL, HDL, LDLCALC, TRIG, CHOLHDL, LDLDIRECT in the last 72 hours. Thyroid Function Tests: No results for input(s): TSH, T4TOTAL, FREET4, T3FREE, THYROIDAB in the last 72 hours. Anemia Panel: Recent Labs    05/31/18 0424  VITAMINB12 615  FOLATE 16.2  FERRITIN 25  TIBC 315  IRON 24*  RETICCTPCT 1.0   Sepsis Labs: No results for input(s): PROCALCITON, LATICACIDVEN in the last 168 hours.  No results found for this or any previous visit (from the past 240 hour(s)).       Radiology Studies: Dg Abd Portable 1v  Result Date: 05/31/2018 CLINICAL DATA:  Small bowel obstruction EXAM: PORTABLE ABDOMEN - 1 VIEW COMPARISON:  05/30/2018 FINDINGS: Left lower lobe opacity, atelectasis versus pneumonia. Nonobstructive bowel gas pattern. Gas within the colon, which is not decompressed. No dilated small bowel centrally. Degenerative changes of the bilateral hips and lower lumbar spine. IMPRESSION: No evidence of bowel obstruction. Left lower lobe opacity, atelectasis versus pneumonia. Electronically Signed   By: Julian Hy M.D.   On: 05/31/2018 08:38        Scheduled Meds:  Continuous Infusions: . dextrose 5 % and 0.9 % NaCl with KCl 20 mEq/L 100 mL/hr at 06/01/18 1449     LOS: 4 days    Time spent: 35 minutes     Elmarie Shiley, MD [facility] Pager [phone]  If 7PM-7AM, please contact night-coverage [email or web address] Password [facility] 06/01/2018, 4:19 PM

## 2018-06-02 LAB — BASIC METABOLIC PANEL
Anion gap: 6 (ref 5–15)
BUN: <5 mg/dL — ABNORMAL LOW (ref 6–20)
CO2: 21 mmol/L — ABNORMAL LOW (ref 22–32)
Calcium: 8.4 mg/dL — ABNORMAL LOW (ref 8.9–10.3)
Chloride: 109 mmol/L (ref 98–111)
Creatinine, Ser: 0.79 mg/dL (ref 0.61–1.24)
GFR calc Af Amer: >60 mL/min (ref >60)
Glucose, Bld: 89 mg/dL (ref 70–99)
Potassium: 3.9 mmol/L (ref 3.5–5.1)
Sodium: 136 mmol/L (ref 135–145)

## 2018-06-02 LAB — GLUCOSE, CAPILLARY: Glucose-Capillary: 86 mg/dL (ref 70–99)

## 2018-06-02 LAB — CHROMOGRANIN A: CHROMOGRANIN A (NG/ML): 49.7 ng/mL (ref 0.0–101.8)
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
## 2018-06-02 NOTE — Progress Notes (Signed)
Patient discharged to home with instructions. 

## 2018-06-02 NOTE — TOC Progression Note (Signed)
Transition of Care [facility]) - Progression Note    Patient Details  Name: Tyler Dominguez MRN: [id number] Date of Birth: 01/25/1981  Transition of Care [facility]) CM/SW [place], [place] Phone Number: 06/02/2018, 10:18 AM  Clinical Narrative:    CSW was stopped in hallway by pt RN and pt mother.  Pt is A&Ox4 and an adult, therefore shared with pt mother that I would have to get permission from pt before sharing any information or speaking privately with mother. Pt mother at first frustrated, but then after receiving permission to speaking with CSW from pt shared with this Probation officer that she is interested in obtaining guardianship of the pt.   CSW provided pt mother with information regarding guardianship and guardianship alternatives from [facility] as well as information from [place] [facility]. Encouraged pt mother to call DSS and ask about next steps, also encouraged her to reach out to pts PCP and legal assistance if needed.   Expected Discharge Plan: Home/Self Care Barriers to Discharge: No Barriers Identified  Expected Discharge Plan and Services Expected Discharge Plan: Home/Self Care     Living arrangements for the past 2 months: Single Family Home Expected Discharge Date: 06/02/18                  Social Determinants of Health (SDOH) Interventions    Readmission Risk Interventions 30 Day Unplanned Readmission Risk Score     ED to Hosp-Admission (Current) from 05/28/2018 in [place]  30 Day Unplanned Readmission Risk Score (%)  15 Filed at 06/02/2018 0801     This score is the patient's risk of an unplanned readmission within 30 days of being discharged (0 -100%). The score is based on dignosis, age, lab data, medications, orders, and past utilization.   Low:  0-14.9   Medium: 15-21.9   High: 22-29.9   Extreme: 30 and above       No flowsheet data found.

## 2018-06-02 NOTE — Discharge Summary (Signed)
Physician Discharge Summary  Tyler Dominguez VZC:[id number] DOB: Jan 17, 1981 DOA: 05/28/2018  PCP: Nolene Ebbs, MD  Admit date: 05/28/2018 Discharge date: 06/02/2018  Admitted From: Home  Disposition:  Home   Recommendations for Outpatient Follow-up:  1. Follow up with PCP in 1-2 weeks 2. Please obtain BMP/CBC in one week 3. Please follow up on the following pending results: Chromogranin A.  4. Needs PET scan.  5. He will need;  5 HIAA 24 hours.    Discharge Condition: stable.  CODE STATUS: full code Diet recommendation: Heart Healthy   Brief/Interim Summary: 38 year old with past medical history significant for tobacco abuse, bipolar who presents to the emergency department complaining of nausea vomiting for 24 hours prior to admission.  CT abdomen showed small bowel obstruction high-grade, due to mesenteric like mass abnormality.  General surgery has been consulted as well as GI.  1-High-grade small bowel obstruction: Differential includes inflammatory bowel disease and possible mass including carcinoid. General surgery and GI consulted. NG tube was removed today.  Having BM.  GI is recommending laparoscopic for biopsy of possible mass.  Less likely inflammatory bowel disease Patient is refusing laparoscopic for biopsy.  Plan was to get octreotide scan , but radiology not longer perform this scan. He will need PET scan, need to be done out patient.  Chromogranin. Pending.  Tolerated diet. Mother was at bedside. She is ok with her son decision of getting PET scan first, and not to proceed with Biopsy.   2-Hypokalemia; replete with IV fluids.  Replaced.   3-Mood disorder: he gets Abilify  injection at [facility] every month.  Resume elavil.   4-Anemia: Hemoglobin trending down.  Iron deficiency anemia.  Received IV iron.  Further evaluation by PCP. Needs referral to GI  Discharge Diagnoses:  Principal Problem:   SBO (small bowel obstruction) ([place]) Active Problems:    Mood disorder ([place])   Small bowel obstruction [facility])    Discharge Instructions  Discharge Instructions    Diet - low sodium heart healthy   Complete by:  As directed    Increase activity slowly   Complete by:  As directed      Allergies as of 06/02/2018   No Known Allergies     Medication List    TAKE these medications   ABILIFY MAINTENA IM Inject 1 Syringe into the muscle every 28 (twenty-eight) days.   acetaminophen 650 MG CR tablet Commonly known as:  TYLENOL Take 650 mg by mouth every 8 (eight) hours as needed for pain.   amitriptyline 50 MG tablet Commonly known as:  ELAVIL Take 50 mg by mouth at bedtime as needed for sleep.   diclofenac sodium 1 % Gel Commonly known as:  VOLTAREN Apply 2 g topically 4 (four) times daily.   MELATONIN PO Take 1 tablet by mouth at bedtime as needed (for sleep).      Follow-up Information    Nolene Ebbs, MD Follow up in 3 day(s).   Specialty:  Internal Medicine Why:  Your Dr need to arrange PET Scna,.  Contact information: [address] [place] [address] [phone]          No Known Allergies  Consultations: Surgery GI  Procedures/Studies: Ct Abdomen Pelvis W Contrast  Result Date: 05/28/2018 CLINICAL DATA:  Patient started vomiting yesterday with difficulty keeping liquids down. EXAM: CT ABDOMEN AND PELVIS WITH CONTRAST TECHNIQUE: Multidetector CT imaging of the abdomen and pelvis was performed using the standard protocol following bolus administration of intravenous contrast. CONTRAST:  161m OMNIPAQUE IOHEXOL 300 MG/ML  SOLN COMPARISON:  None. FINDINGS: Lower chest: Borderline cardiomegaly without pericardial effusion. Atelectasis is seen at the lung bases. No significant pleural effusion or pneumothorax. Hepatobiliary: Uncomplicated cholelithiasis. No secondary signs of acute cholecystitis. No liver lesion or biliary dilatation. Pancreas: No inflammation, pathologic ductal dilatation or mass. Spleen:  Normal Adrenals/Urinary Tract: Normal bilateral adrenal glands, kidneys and ureters. The urinary bladder is unremarkable for the degree of distention. Stomach/Bowel: High-grade early or partial SBO secondary to a retractile masslike abnormality in the mid pelvis that may represent carcinoid or stigmata of advanced inflammatory bowel disease. Dilated fluid filled jejunal loops are identified measuring up to 4 cm. Adjacent ileal loops are decompressed but tethered in appearance due to this abnormality and there is mucosal enhancement of nondistended distal and terminal ileum with mural thickening. A small amount of fluid is seen within the colon without pathologic dilatation of the colon. The appendix is not confidently identified. Vascular/Lymphatic: Small mesenteric lymph nodes are noted. Reproductive: Mild enlargement of the prostate measuring 6.2 x 4.1 x 4 cm. Other: No free air nor free fluid. Small periumbilical fat containing hernia. Musculoskeletal: Sclerosis of the sacroiliac joints bilaterally with smooth flowing syndesmophytes. No acute fracture nor suspicious osseous lesions. IMPRESSION: 1. High-grade partial small-bowel obstruction due to a mesenteric masslike abnormality in the mid pelvis that may reflect inflammatory tissue potentially from prior bouts of inflammatory bowel disease. This results in retraction and tethering of adjacent ileal loops with dilatation of more proximal jejunal loops up to 4 cm. Differential possibility may also include the possibility of midgut carcinoid tumor. Given findings of sacroiliitis and smooth syndesmophyte formation, stigmata of inflammatory bowel disease and an HLA B27 disorder is favored. An Indium 111 study if evaluation for carcinoid is necessary could be performed from an imaging standpoint. 2. Prostatomegaly. 3. Uncomplicated cholelithiasis. Electronically Signed   By: DAshley RoyaltyM.D.   On: 05/28/2018 19:18   Dg Abd Portable 1v  Result Date:  05/31/2018 CLINICAL DATA:  Small bowel obstruction EXAM: PORTABLE ABDOMEN - 1 VIEW COMPARISON:  05/30/2018 FINDINGS: Left lower lobe opacity, atelectasis versus pneumonia. Nonobstructive bowel gas pattern. Gas within the colon, which is not decompressed. No dilated small bowel centrally. Degenerative changes of the bilateral hips and lower lumbar spine. IMPRESSION: No evidence of bowel obstruction. Left lower lobe opacity, atelectasis versus pneumonia. Electronically Signed   By: SJulian HyM.D.   On: 05/31/2018 08:38   Dg Abd Portable 1v  Result Date: 05/30/2018 CLINICAL DATA:  Small bowel obstruction follow-up EXAM: PORTABLE ABDOMEN - 1 VIEW COMPARISON:  05/29/2018 radiograph and 05/28/2018 CT FINDINGS: Mild gaseous distension of small bowel small bowel has slightly decreased. Small amount of colonic gas is again noted. No other significant change. IMPRESSION: Slightly decreased mild gaseous distension of small bowel loops. Electronically Signed   By: JMargarette CanadaM.D.   On: 05/30/2018 08:26   Dg Abd Portable 1v  Result Date: 05/29/2018 CLINICAL DATA:  38year old with past medical history significant for tobacco abuse, remote disorder who presents to the emergency department complaining of nausea vomiting for 24 hours prior to admission. CT abdomen showed small bowel obstruct.*comment was truncated* EXAM: PORTABLE ABDOMEN - 1 VIEW COMPARISON:  CT 05/28/2018 FINDINGS: Persistent dilated loops of small bowel in the mid abdomen measuring 3.7 cm compared to 4.7 cm on CT topogram. NG tube tip is seen in the proximal stomach. Small amount gas the rectum. IMPRESSION: Persistent small bowel obstruction pattern. NG tube with tip  in the stomach. Small volume gas in the rectum. Electronically Signed   By: Suzy Bouchard M.D.   On: 05/29/2018 12:45      Subjective:  denies abdominal pain. Tolerating diet.   Discharge Exam: Vitals:   06/01/18 2047 06/02/18 0503  BP: 114/67 140/89  Pulse: 74 77   Resp: 18 19  Temp: 98.6 F (37 C) 98.8 F (37.1 C)  SpO2: 94% 97%     General: Pt is alert, awake, not in acute distress Cardiovascular: RRR, S1/S2 +, no rubs, no gallops Respiratory: CTA bilaterally, no wheezing, no rhonchi Abdominal: Soft, NT, ND, bowel sounds + Extremities: no edema, no cyanosis    The results of significant diagnostics from this hospitalization (including imaging, microbiology, ancillary and laboratory) are listed below for reference.     Microbiology: No results found for this or any previous visit (from the past 240 hour(s)).   Labs: BNP (last 3 results) No results for input(s): BNP in the last 8760 hours. Basic Metabolic Panel: Recent Labs  Lab 05/29/18 0358 05/30/18 0335 05/31/18 0424 06/01/18 0344 06/02/18 0811  NA 141 138 138 136 136  K 3.1* 3.3* 3.3* 3.3* 3.9  CL 104 106 108 104 109  CO2 '29 24 24 25 ' 21*  GLUCOSE 110* 99 92 102* 89  BUN 15 7 <5* <5* <5*  CREATININE 0.78 0.74 0.63 0.64 0.79  CALCIUM 8.5* 8.6* 8.3* 8.6* 8.4*   Liver Function Tests: Recent Labs  Lab 05/28/18 1559 05/29/18 0358 06/01/18 0344  AST 16 10* 9*  ALT '13 9 8  ' ALKPHOS 102 80 81  BILITOT 0.8 0.6 0.6  PROT 8.9* 7.0 6.6  ALBUMIN 3.9 3.0* 2.9*   Recent Labs  Lab 05/28/18 1559  LIPASE 20   No results for input(s): AMMONIA in the last 168 hours. CBC: Recent Labs  Lab 05/28/18 1559 05/29/18 0358 05/30/18 0335 05/31/18 0424 06/01/18 0344  WBC 6.5 4.8 5.4 4.4 4.6  HGB 13.1 11.0* 10.5* 10.1* 10.7*  HCT 44.0 36.6* 34.0* 34.2* 35.4*  MCV 72.1* 71.6* 72.6* 72.2* 71.5*  PLT 441* 373 351 356 390   Cardiac Enzymes: No results for input(s): CKTOTAL, CKMB, CKMBINDEX, TROPONINI in the last 168 hours. BNP: Invalid input(s): POCBNP CBG: Recent Labs  Lab 06/01/18 0436 06/01/18 0809 06/01/18 1228 06/01/18 2044 06/02/18 0500  GLUCAP 87 85 80 107* 86   D-Dimer No results for input(s): DDIMER in the last 72 hours. Hgb A1c No results for input(s):  HGBA1C in the last 72 hours. Lipid Profile No results for input(s): CHOL, HDL, LDLCALC, TRIG, CHOLHDL, LDLDIRECT in the last 72 hours. Thyroid function studies No results for input(s): TSH, T4TOTAL, T3FREE, THYROIDAB in the last 72 hours.  Invalid input(s): FREET3 Anemia work up Recent Labs    05/31/18 0424  VITAMINB12 615  FOLATE 16.2  FERRITIN 25  TIBC 315  IRON 24*  RETICCTPCT 1.0   Urinalysis    Component Value Date/Time   COLORURINE AMBER (A) 05/28/2018 1622   APPEARANCEUR HAZY (A) 05/28/2018 1622   LABSPEC 1.039 (H) 05/28/2018 1622   PHURINE 5.0 05/28/2018 1622   GLUCOSEU NEGATIVE 05/28/2018 1622   HGBUR SMALL (A) 05/28/2018 1622   BILIRUBINUR SMALL (A) 05/28/2018 1622   KETONESUR 5 (A) 05/28/2018 1622   PROTEINUR 100 (A) 05/28/2018 1622   UROBILINOGEN 1.0 06/23/2017 1301   NITRITE NEGATIVE 05/28/2018 1622   LEUKOCYTESUR NEGATIVE 05/28/2018 1622   Sepsis Labs Invalid input(s): PROCALCITONIN,  WBC,  LACTICIDVEN Microbiology No results found for  this or any previous visit (from the past 240 hour(s)).   Time coordinating discharge: 40 minutes  SIGNED:   Elmarie Shiley, MD  [facility]

## 2018-12-03 ENCOUNTER — Other Ambulatory Visit: Payer: Self-pay

## 2018-12-03 ENCOUNTER — Emergency Department (HOSPITAL_COMMUNITY)
Admission: EM | Admit: 2018-12-03 | Discharge: 2018-12-03 | Discharge status: Against Medical Advice | Payer: Medicaid Other

## 2018-12-03 NOTE — ED Notes (Signed)
Patient went to the bathroom. Triage started when he returned. Patient said, "maske this quick. My ride is about to leave. patient left out of triage.

## 2018-12-03 NOTE — ED Triage Notes (Signed)
Patient states, "It has been 2 years and some change since I slept."

## 2019-02-03 ENCOUNTER — Encounter (HOSPITAL_COMMUNITY): Payer: Self-pay | Admitting: Emergency Medicine

## 2019-02-03 ENCOUNTER — Other Ambulatory Visit: Payer: Self-pay

## 2019-02-03 ENCOUNTER — Ambulatory Visit (HOSPITAL_COMMUNITY)
Admission: EM | Admit: 2019-02-03 | Discharge: 2019-02-03 | Disposition: A | Discharge status: Home / Self Care | Payer: Medicaid Other | Attending: Family Medicine | Admitting: Family Medicine

## 2019-02-03 DIAGNOSIS — Z20828 Contact with and (suspected) exposure to other viral communicable diseases: Secondary | ICD-10-CM | POA: Insufficient documentation

## 2019-02-03 DIAGNOSIS — Z0189 Encounter for other specified special examinations: Secondary | ICD-10-CM | POA: Diagnosis not present

## 2019-02-03 DIAGNOSIS — F17210 Nicotine dependence, cigarettes, uncomplicated: Secondary | ICD-10-CM | POA: Diagnosis not present
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
## 2019-02-03 NOTE — ED Triage Notes (Signed)
Seen by provider prior to this nurse Patient requesting covid testing only

## 2019-02-03 NOTE — ED Notes (Signed)
Refused std testing.  Says mother wants him to have covid testing

## 2019-02-03 NOTE — Discharge Instructions (Addendum)
If your Covid-19 test is positive, you will get a phone call from [facility] regarding your results. If your Covid-19 test is negative, you will NOT get a phone call from [facility] with your results. You may view your results on MyChart. If you do not have a MyChart account, sign up instructions are in your discharge papers. ° °

## 2019-02-05 LAB — NOVEL CORONAVIRUS, NAA (HOSP ORDER, SEND-OUT TO REF LAB; TAT 18-24 HRS): SARS-CoV-2, NAA: NOT DETECTED

## 2019-05-03 ENCOUNTER — Ambulatory Visit (HOSPITAL_COMMUNITY)
Admission: EM | Admit: 2019-05-03 | Discharge: 2019-05-03 | Disposition: A | Discharge status: Home / Self Care | Payer: Medicaid Other | Attending: Family Medicine | Admitting: Family Medicine

## 2019-05-03 ENCOUNTER — Other Ambulatory Visit: Payer: Self-pay

## 2019-05-03 ENCOUNTER — Encounter (HOSPITAL_COMMUNITY): Payer: Self-pay

## 2019-05-03 DIAGNOSIS — Z20822 Contact with and (suspected) exposure to covid-19: Secondary | ICD-10-CM

## 2019-05-03 NOTE — ED Triage Notes (Signed)
Pt states he just wants to be tested for Covid. Pt has no symptoms.

## 2019-05-03 NOTE — Discharge Instructions (Addendum)
You have been tested for COVID-19 today. °If your test returns positive, you will receive a phone call from [facility] regarding your results. °Negative test results are not called. °Both positive and negative results area always visible on MyChart. °If you do not have a MyChart account, sign up instructions are provided in your discharge papers. °Please do not hesitate to contact us should you have questions or concerns. ° °
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
## 2019-05-05 LAB — NOVEL CORONAVIRUS, NAA (HOSP ORDER, SEND-OUT TO REF LAB; TAT 18-24 HRS): SARS-CoV-2, NAA: NOT DETECTED

## 2019-05-09 ENCOUNTER — Encounter (HOSPITAL_COMMUNITY): Payer: Self-pay

## 2019-05-09 ENCOUNTER — Emergency Department (HOSPITAL_COMMUNITY): Payer: Medicaid Other

## 2019-05-09 ENCOUNTER — Emergency Department (HOSPITAL_COMMUNITY)
Admission: EM | Admit: 2019-05-09 | Discharge: 2019-05-09 | Discharge status: Against Medical Advice | Payer: Medicaid Other | Attending: Emergency Medicine | Admitting: Emergency Medicine

## 2019-05-09 ENCOUNTER — Other Ambulatory Visit: Payer: Self-pay

## 2019-05-09 DIAGNOSIS — Z79899 Other long term (current) drug therapy: Secondary | ICD-10-CM | POA: Insufficient documentation

## 2019-05-09 DIAGNOSIS — K56609 Unspecified intestinal obstruction, unspecified as to partial versus complete obstruction: Secondary | ICD-10-CM | POA: Insufficient documentation

## 2019-05-09 DIAGNOSIS — Z20822 Contact with and (suspected) exposure to covid-19: Secondary | ICD-10-CM | POA: Diagnosis not present

## 2019-05-09 DIAGNOSIS — F17210 Nicotine dependence, cigarettes, uncomplicated: Secondary | ICD-10-CM | POA: Insufficient documentation

## 2019-05-09 DIAGNOSIS — R112 Nausea with vomiting, unspecified: Secondary | ICD-10-CM | POA: Diagnosis present

## 2019-05-09 LAB — COMPREHENSIVE METABOLIC PANEL
ALT: 18 U/L (ref 0–44)
AST: 15 U/L (ref 15–41)
Albumin: 4 g/dL (ref 3.5–5.0)
Alkaline Phosphatase: 94 U/L (ref 38–126)
Anion gap: 13 (ref 5–15)
BUN: 27 mg/dL — ABNORMAL HIGH (ref 6–20)
CO2: 27 mmol/L (ref 22–32)
Calcium: 9.4 mg/dL (ref 8.9–10.3)
Chloride: 97 mmol/L — ABNORMAL LOW (ref 98–111)
Creatinine, Ser: 1.08 mg/dL (ref 0.61–1.24)
GFR calc Af Amer: >60 mL/min (ref >60)
GFR calc non Af Amer: >60 mL/min (ref >60)
Glucose, Bld: 167 mg/dL — ABNORMAL HIGH (ref 70–99)
Potassium: 4 mmol/L (ref 3.5–5.1)
Sodium: 137 mmol/L (ref 135–145)
Total Bilirubin: 0.9 mg/dL (ref 0.3–1.2)
Total Protein: 9.1 g/dL — ABNORMAL HIGH (ref 6.5–8.1)

## 2019-05-09 LAB — URINALYSIS, ROUTINE W REFLEX MICROSCOPIC
Bilirubin Urine: NEGATIVE
Glucose, UA: NEGATIVE mg/dL
Hgb urine dipstick: NEGATIVE
Ketones, ur: NEGATIVE mg/dL
Leukocytes,Ua: NEGATIVE
Nitrite: NEGATIVE
Protein, ur: NEGATIVE mg/dL
Specific Gravity, Urine: >1.046 — ABNORMAL HIGH (ref 1.005–1.030)
pH: 9 — ABNORMAL HIGH (ref 5.0–8.0)

## 2019-05-09 LAB — CBC
HCT: 48.5 % (ref 39.0–52.0)
Hemoglobin: 15.5 g/dL (ref 13.0–17.0)
MCH: 25.5 pg — ABNORMAL LOW (ref 26.0–34.0)
MCHC: 32 g/dL (ref 30.0–36.0)
MCV: 79.8 fL — ABNORMAL LOW (ref 80.0–100.0)
Platelets: 387 10*3/uL (ref 150–400)
RBC: 6.08 MIL/uL — ABNORMAL HIGH (ref 4.22–5.81)
RDW: 18 % — ABNORMAL HIGH (ref 11.5–15.5)
WBC: 8.9 10*3/uL (ref 4.0–10.5)
nRBC: 0 % (ref 0.0–0.2)

## 2019-05-09 LAB — LIPASE, BLOOD: Lipase: 19 U/L (ref 11–51)

## 2019-05-09 LAB — SARS CORONAVIRUS 2 (TAT 6-24 HRS): SARS Coronavirus 2: NEGATIVE

## 2019-05-09 MED ORDER — SODIUM CHLORIDE (PF) 0.9 % IJ SOLN
INTRAMUSCULAR | Status: AC
  Filled 2019-05-09: qty 50

## 2019-05-09 MED ORDER — SODIUM CHLORIDE 0.9% FLUSH: 3.0000 mL | Freq: Once | INTRAVENOUS | Status: DC

## 2019-05-09 MED ORDER — IOHEXOL 300 MG/ML  SOLN
100.0000 mL | Freq: Once | INTRAMUSCULAR | Status: AC | PRN
  Administered 2019-05-09: 100 mL via INTRAVENOUS

## 2019-05-09 MED ORDER — ONDANSETRON HCL 4 MG/2ML IJ SOLN
4.0000 mg | Freq: Once | INTRAMUSCULAR | Status: AC
  Administered 2019-05-09: 01:00:00 4 mg via INTRAVENOUS
  Filled 2019-05-09: qty 2

## 2019-05-09 MED ORDER — SODIUM CHLORIDE 0.9 % IV BOLUS
1000.0000 mL | Freq: Once | INTRAVENOUS | Status: AC
  Administered 2019-05-09: 01:00:00 1000 mL via INTRAVENOUS

## 2019-05-09 MED ORDER — LORAZEPAM 2 MG/ML IJ SOLN
1.0000 mg | Freq: Once | INTRAMUSCULAR | Status: AC
  Administered 2019-05-09: 1 mg via INTRAVENOUS
  Filled 2019-05-09: qty 1

## 2019-05-09 NOTE — Plan of Care (Signed)
Patient is a 39 year old gentleman with history of bipolar disorder, mood disorder and previous small bowel obstruction.  He presented to the ED for further evaluation of persistent nausea and vomiting which started yesterday.  On assessment at the ED, he was noted to high-grade small bowel obstruction.  Case was discussed with general surgeon Dr. Marcello Moores who recommended admission and initial trial of conservative management with nasogastric decompression and supportive care.  However, he had the NG tube removed and is refusing subsequent placement of NG tube.  He is also refusing admission and is requesting to be discharged home.  I discussed with ED provider regarding patient refusal of treatment and admission.  I recommended psychiatry evaluation of patient regarding his insight of present illness or condition and refusal of care.

## 2019-05-09 NOTE — ED Notes (Signed)
Pt encouraged to void for urine specimen but isn't able to at this time. Pt has urinal at bedside.
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
## 2019-05-09 NOTE — Plan of Care (Signed)
Patient is a 39 yo male with a history of bipolar disorder on Abilify who presented secondary to abdominal pain and emesis. I went to see patient to discuss willingness of admission. Patient states he has "things to do" and does not want to be admitted. I discussed risks of leaving without treatment including, but not limited to, death. Patient able to show understanding of risks and the possible consequences of his actions. States he will come back if he needs to. Discussed with EDP face-to-face.  Cordelia Poche, MD [facility] 05/09/2019, 7:41 AM

## 2019-05-09 NOTE — ED Provider Notes (Signed)
Patient seen/examined in the Emergency Department in conjunction with Midlevel Provider Rona Ravens Patient reports abdominal pain/vomiting Exam : Alert, no acute distress.  Abdominal distention noted with decreased bowel sounds noted Plan: Patient found to have small bowel obstruction.  He will need to be admitted   Ripley Fraise, MD 05/09/19 [phone]
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
## 2019-05-09 NOTE — ED Notes (Signed)
NG tube placed, and 360ml of gastric fluids removed immediately, patient question how would it have to stay in and once told patient requested tube to be removed, Provider aware

## 2019-05-09 NOTE — ED Notes (Signed)
Patient explained risks about leaving and benefits on staying with his current diagnosis. Patient still wants to go home. Family is at beside and made aware of pt condition.

## 2019-05-09 NOTE — ED Notes (Signed)
Patient states that he wants to go home and with some emesis medication.

## 2019-05-09 NOTE — ED Triage Notes (Signed)
Pt reports that he started vomiting yesterday and has not been able to tolerate anything PO since. Denies pain. Denies hematemesis.

## 2019-05-09 NOTE — Discharge Instructions (Addendum)
You are diagnosed with a bowel obstruction, from an unknown cause.  You may have a tumor in your abdomen causing the bowel obstruction.  You had a similar problem last year, and it is concerning that this has recurred.  We would like you to stay in the hospital for further treatment and observation.  You are choosing to leave, against our advice, and told us that you understand that your condition can worsen, and that you could even die from this.  You are encouraged to return here immediately if your symptoms worsen or if you want to have additional treatment.  Do not eat food, at this time.  You can start by drinking some clear liquids, very small amounts, gradually today.  Again if this causes problems or you have worsening symptoms, return here immediately.

## 2019-05-09 NOTE — ED Provider Notes (Signed)
7:30 AM-he was evaluated by hospitalist, at this time, and the patient refuses NG tube and admission.  The hospitalist determined that the patient has capacity to decide against treatment.  8:20 AM-patient is alert and comfortable.  Abdomen appears distended.  He is not spitting saliva or vomiting.  Patient states he wants to leave because "I have things to do."  He indicates that there is some kind of monetary issue that he needs to attend to.  He states that his mother will come and pick him up around 10 AM.  He does not want to currently stay in the hospital or receive NG decompression.  He understands that his condition could worsen, and it could be dangerous and even cause him to die.  He states that he will return, if he gets worse, to get treatment.  At this point he will be maintained as n.p.o., and await his family member to pick him up at 42 AM.  10:10 AM-he is currently talking to his mother on the telephone and try to explain why he does not want to get treatment in the hospital, to her.   Patient Vitals for the past 24 hrs:  BP Temp Temp src Pulse Resp SpO2 Height Weight  05/09/19 1103 119/76 -- -- (!) 119 15 95 % -- --  05/09/19 1100 119/76 -- -- -- -- -- -- --  05/09/19 0900 118/87 -- -- (!) 120 17 95 % -- --  05/09/19 0812 96/85 -- -- (!) 105 16 97 % -- --  05/09/19 0607 111/85 -- -- 98 15 95 % -- --  05/09/19 0400 103/78 -- -- (!) 105 15 96 % -- --  05/09/19 0300 117/90 -- -- (!) 104 16 93 % -- --  05/09/19 0200 119/86 -- -- (!) 104 19 98 % -- --  05/09/19 0118 129/87 -- -- (!) 114 20 95 % -- --  05/09/19 0044 (!) 142/95 98.6 F (37 C) Oral (!) 132 20 94 % 5\' 8"  (1.727 m) 74.8 kg    11:19 AM Reevaluation with update and discussion. After initial assessment and treatment, an updated evaluation reveals his mother is now here and is attempting to convince him to stay.  After I informed her of the specific findings on the CT images, she continue to try to encourage him to stay.  He  has no decided that he will not stay, and his mother is with him and will take him home.  Patient is requesting medication for nausea.  Findings discussed, precautions given, and questions answered. Daleen Bo   Medical Decision Making: Nausea vomiting with abdominal pain and abdominal distention.  Intestinal obstruction, evident on CT imaging, earlier this morning.  Since that time he has had a bowel movement, which he described as diarrhea, in the commode, of the bathroom, in the emergency department.  Patient continues to want to go home, and not be admitted for assessment and treatment of bowel obstruction.  He has the capacity to decide to leave, in my estimation as well as the hospitalist who saw him earlier.  His mother is supportive and stated that she would bring him back, if he gets worse.  Both the patient and his mother understand that this is likely a recurrence of the presenting problem, and we do not know if there is a specific cause such as carcinoid tumor that will need to be addressed.  They both understand that his condition could worsen and could even cause death.  Napoleon Form  D Casserly was evaluated in Emergency Department on 05/09/2019 for the symptoms described in the history of present illness. He was evaluated in the context of the global COVID-19 pandemic, which necessitated consideration that the patient might be at risk for infection with the SARS-CoV-2 virus that causes COVID-19. Institutional protocols and algorithms that pertain to the evaluation of patients at risk for COVID-19 are in a state of rapid change based on information released by regulatory bodies including the CDC and federal and state organizations. These policies and algorithms were followed during the patient's care in the ED.   CRITICAL CARE-no Performed by: Daleen Bo   Nursing Notes Reviewed/ Care Coordinated Applicable Imaging Reviewed Interpretation of Laboratory Data incorporated into ED  treatment  The patient appears reasonably screened and/or stabilized for discharge and I doubt any other medical condition or other [facility] requiring further screening, evaluation, or treatment in the ED at this time prior to discharge.  Plan: Home Medications-continue usual medications; Home Treatments-start with clear liquid diet and gradually advance if tolerated; return here if the recommended treatment, does not improve the symptoms; Recommended follow up-return here for worsening condition, follow-up with a primary care doctor for routine management in 1 to 2 weeks.      Daleen Bo, MD 05/09/19 1126

## 2019-05-10 ENCOUNTER — Ambulatory Visit: Payer: Self-pay

## 2019-05-10 NOTE — Telephone Encounter (Signed)
Pt. And his mother calling to ask questions about his diet. Seen in ED for small bowel obstruction yesterday. Reviewed clear liquid diet and how to advance diet slowly. Verbalizes understanding.

## 2019-06-02 ENCOUNTER — Encounter (HOSPITAL_COMMUNITY): Payer: Self-pay

## 2019-06-02 ENCOUNTER — Ambulatory Visit (HOSPITAL_COMMUNITY)
Admission: EM | Admit: 2019-06-02 | Discharge: 2019-06-02 | Disposition: A | Discharge status: Home / Self Care | Payer: Medicaid Other

## 2019-06-02 ENCOUNTER — Other Ambulatory Visit: Payer: Self-pay

## 2019-06-02 NOTE — ED Triage Notes (Addendum)
Pt states he wants to be tested for COVID, because he was exposed to someone that tested Positive for COVID. Pt denies COVID symptoms at this time.

## 2019-06-06 ENCOUNTER — Other Ambulatory Visit: Payer: Self-pay

## 2019-06-06 ENCOUNTER — Encounter (HOSPITAL_COMMUNITY): Payer: Self-pay

## 2019-06-06 ENCOUNTER — Ambulatory Visit (HOSPITAL_COMMUNITY)
Admission: EM | Admit: 2019-06-06 | Discharge: 2019-06-06 | Disposition: A | Discharge status: Home / Self Care | Payer: Medicaid Other | Attending: Internal Medicine | Admitting: Internal Medicine

## 2019-06-06 DIAGNOSIS — Z20822 Contact with and (suspected) exposure to covid-19: Secondary | ICD-10-CM | POA: Insufficient documentation

## 2019-06-06 LAB — SARS CORONAVIRUS 2 (TAT 6-24 HRS): SARS Coronavirus 2: NEGATIVE
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
## 2019-06-06 NOTE — Discharge Instructions (Signed)
Monitor MyChart for result

## 2019-06-06 NOTE — ED Triage Notes (Signed)
Patient states that a couple of weeks ago he was around someone and is concerned he was exposed to Covid. Denies that he is having any covid like symptoms.

## 2019-08-04 ENCOUNTER — Encounter (HOSPITAL_COMMUNITY): Payer: Self-pay

## 2019-08-04 ENCOUNTER — Ambulatory Visit (HOSPITAL_COMMUNITY)
Admission: EM | Admit: 2019-08-04 | Discharge: 2019-08-04 | Disposition: A | Discharge status: Home / Self Care | Payer: Medicaid Other | Attending: Family Medicine | Admitting: Family Medicine

## 2019-08-04 ENCOUNTER — Other Ambulatory Visit: Payer: Self-pay

## 2019-08-04 DIAGNOSIS — Z20822 Contact with and (suspected) exposure to covid-19: Secondary | ICD-10-CM | POA: Insufficient documentation
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
## 2019-08-04 NOTE — ED Triage Notes (Signed)
Patient requesting covid testing. Denies symptoms or exposure.

## 2019-08-04 NOTE — Discharge Instructions (Addendum)
You have been tested for COVID-19 today. °If your test returns positive, you will receive a phone call from [facility] regarding your results. °Negative test results are not called. °Both positive and negative results area always visible on MyChart. °If you do not have a MyChart account, sign up instructions are provided in your discharge papers. °Please do not hesitate to contact us should you have questions or concerns. ° °

## 2019-08-05 LAB — SARS CORONAVIRUS 2 (TAT 6-24 HRS): SARS Coronavirus 2: NEGATIVE

## 2019-09-16 ENCOUNTER — Ambulatory Visit (HOSPITAL_COMMUNITY)
Admission: EM | Admit: 2019-09-16 | Discharge: 2019-09-16 | Disposition: A | Discharge status: Home / Self Care | Payer: Medicaid Other | Attending: Family Medicine | Admitting: Family Medicine

## 2019-09-16 ENCOUNTER — Encounter (HOSPITAL_COMMUNITY): Payer: Self-pay

## 2019-09-16 ENCOUNTER — Other Ambulatory Visit: Payer: Self-pay

## 2019-09-16 DIAGNOSIS — Z20822 Contact with and (suspected) exposure to covid-19: Secondary | ICD-10-CM | POA: Diagnosis present

## 2019-09-16 DIAGNOSIS — Z1152 Encounter for screening for COVID-19: Secondary | ICD-10-CM | POA: Diagnosis present

## 2019-09-16 LAB — SARS CORONAVIRUS 2 (TAT 6-24 HRS): SARS Coronavirus 2: NEGATIVE

## 2019-09-16 NOTE — Discharge Instructions (Signed)
Your COVID test is pending.  You should self quarantine until the test result is back.    Take Tylenol as needed for fever or discomfort.  Rest and keep yourself hydrated.    Go to the emergency department if you develop shortness of breath, severe diarrhea, high fever not relieved by Tylenol or ibuprofen, or other concerning symptoms.    

## 2019-09-16 NOTE — ED Triage Notes (Signed)
Pt wants COVID test, was told someone he was around was COVID +.
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
## 2019-10-20 ENCOUNTER — Encounter (HOSPITAL_COMMUNITY): Payer: Self-pay | Admitting: Emergency Medicine

## 2019-10-20 ENCOUNTER — Other Ambulatory Visit: Payer: Self-pay

## 2019-10-20 DIAGNOSIS — K56609 Unspecified intestinal obstruction, unspecified as to partial versus complete obstruction: Secondary | ICD-10-CM | POA: Insufficient documentation

## 2019-10-20 DIAGNOSIS — R197 Diarrhea, unspecified: Secondary | ICD-10-CM | POA: Insufficient documentation

## 2019-10-20 DIAGNOSIS — F17210 Nicotine dependence, cigarettes, uncomplicated: Secondary | ICD-10-CM | POA: Insufficient documentation

## 2019-10-20 DIAGNOSIS — R112 Nausea with vomiting, unspecified: Secondary | ICD-10-CM | POA: Diagnosis present

## 2019-10-20 LAB — URINALYSIS, ROUTINE W REFLEX MICROSCOPIC
Bacteria, UA: NONE SEEN
Glucose, UA: NEGATIVE mg/dL
Hgb urine dipstick: NEGATIVE
Ketones, ur: 20 mg/dL — AB
Leukocytes,Ua: NEGATIVE
Nitrite: NEGATIVE
Protein, ur: 100 mg/dL — AB
Specific Gravity, Urine: 1.038 — ABNORMAL HIGH (ref 1.005–1.030)
pH: 5 (ref 5.0–8.0)

## 2019-10-20 LAB — CBC
HCT: 48 % (ref 39.0–52.0)
Hemoglobin: 15.6 g/dL (ref 13.0–17.0)
MCH: 25.9 pg — ABNORMAL LOW (ref 26.0–34.0)
MCHC: 32.5 g/dL (ref 30.0–36.0)
MCV: 79.7 fL — ABNORMAL LOW (ref 80.0–100.0)
Platelets: 444 10*3/uL — ABNORMAL HIGH (ref 150–400)
RBC: 6.02 MIL/uL — ABNORMAL HIGH (ref 4.22–5.81)
RDW: 15.8 % — ABNORMAL HIGH (ref 11.5–15.5)
WBC: 6.5 10*3/uL (ref 4.0–10.5)
nRBC: 0 % (ref 0.0–0.2)

## 2019-10-20 MED ORDER — ONDANSETRON 4 MG PO TBDP
4.0000 mg | ORAL_TABLET | Freq: Once | ORAL | Status: AC | PRN
  Administered 2019-10-20: 4 mg via ORAL
  Filled 2019-10-20: qty 1

## 2019-10-20 MED ORDER — SODIUM CHLORIDE 0.9% FLUSH: 3.0000 mL | Freq: Once | INTRAVENOUS | Status: AC

## 2019-10-20 NOTE — ED Triage Notes (Signed)
Patient is complaining of vomiting for 5 days. Patient states that he is not having any pain.

## 2019-10-21 ENCOUNTER — Emergency Department (HOSPITAL_COMMUNITY)
Admission: EM | Admit: 2019-10-21 | Discharge: 2019-10-21 | Discharge status: Against Medical Advice | Payer: Medicaid Other | Attending: Emergency Medicine | Admitting: Emergency Medicine

## 2019-10-21 ENCOUNTER — Emergency Department (HOSPITAL_COMMUNITY): Payer: Medicaid Other

## 2019-10-21 ENCOUNTER — Encounter (HOSPITAL_COMMUNITY): Payer: Self-pay | Admitting: Emergency Medicine

## 2019-10-21 DIAGNOSIS — K56609 Unspecified intestinal obstruction, unspecified as to partial versus complete obstruction: Secondary | ICD-10-CM

## 2019-10-21 LAB — COMPREHENSIVE METABOLIC PANEL
ALT: 10 U/L (ref 0–44)
AST: 11 U/L — ABNORMAL LOW (ref 15–41)
Albumin: 3.7 g/dL (ref 3.5–5.0)
Alkaline Phosphatase: 94 U/L (ref 38–126)
Anion gap: 15 (ref 5–15)
BUN: 15 mg/dL (ref 6–20)
CO2: 28 mmol/L (ref 22–32)
Calcium: 9.2 mg/dL (ref 8.9–10.3)
Chloride: 94 mmol/L — ABNORMAL LOW (ref 98–111)
Creatinine, Ser: 0.83 mg/dL (ref 0.61–1.24)
GFR calc Af Amer: >60 mL/min (ref >60)
GFR calc non Af Amer: >60 mL/min (ref >60)
Glucose, Bld: 113 mg/dL — ABNORMAL HIGH (ref 70–99)
Potassium: 4 mmol/L (ref 3.5–5.1)
Sodium: 137 mmol/L (ref 135–145)
Total Bilirubin: 0.9 mg/dL (ref 0.3–1.2)
Total Protein: 8.3 g/dL — ABNORMAL HIGH (ref 6.5–8.1)

## 2019-10-21 LAB — LIPASE, BLOOD: Lipase: 20 U/L (ref 11–51)

## 2019-10-21 MED ORDER — SODIUM CHLORIDE (PF) 0.9 % IJ SOLN
INTRAMUSCULAR | Status: AC
  Administered 2019-10-21: 3 mL via INTRAVENOUS
  Filled 2019-10-21: qty 50

## 2019-10-21 MED ORDER — IOHEXOL 300 MG/ML  SOLN
100.0000 mL | Freq: Once | INTRAMUSCULAR | Status: AC | PRN
  Administered 2019-10-21: 100 mL via INTRAVENOUS

## 2019-10-21 NOTE — ED Notes (Signed)
Patient asked several time to stay due to him having a bowel obstruction. Patient continued to say no and that he was leaving.
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
## 2020-01-06 ENCOUNTER — Inpatient Hospital Stay (HOSPITAL_COMMUNITY)
Admission: EM | Admit: 2020-01-06 | Discharge: 2020-01-16 | DRG: 329 | Disposition: A | Discharge status: Home Health | Payer: Medicaid Other | Attending: Internal Medicine | Admitting: Internal Medicine

## 2020-01-06 ENCOUNTER — Emergency Department (HOSPITAL_COMMUNITY): Payer: Medicaid Other

## 2020-01-06 ENCOUNTER — Other Ambulatory Visit: Payer: Self-pay

## 2020-01-06 ENCOUNTER — Encounter (HOSPITAL_COMMUNITY): Payer: Self-pay | Admitting: *Deleted

## 2020-01-06 DIAGNOSIS — Z0189 Encounter for other specified special examinations: Secondary | ICD-10-CM

## 2020-01-06 DIAGNOSIS — R109 Unspecified abdominal pain: Secondary | ICD-10-CM | POA: Diagnosis present

## 2020-01-06 DIAGNOSIS — K56609 Unspecified intestinal obstruction, unspecified as to partial versus complete obstruction: Secondary | ICD-10-CM | POA: Diagnosis not present

## 2020-01-06 DIAGNOSIS — N400 Enlarged prostate without lower urinary tract symptoms: Secondary | ICD-10-CM | POA: Diagnosis present

## 2020-01-06 DIAGNOSIS — E8809 Other disorders of plasma-protein metabolism, not elsewhere classified: Secondary | ICD-10-CM

## 2020-01-06 DIAGNOSIS — F319 Bipolar disorder, unspecified: Secondary | ICD-10-CM | POA: Diagnosis present

## 2020-01-06 DIAGNOSIS — E861 Hypovolemia: Secondary | ICD-10-CM | POA: Diagnosis present

## 2020-01-06 DIAGNOSIS — S301XXA Contusion of abdominal wall, initial encounter: Secondary | ICD-10-CM | POA: Diagnosis not present

## 2020-01-06 DIAGNOSIS — K50012 Crohn's disease of small intestine with intestinal obstruction: Principal | ICD-10-CM

## 2020-01-06 DIAGNOSIS — D62 Acute posthemorrhagic anemia: Secondary | ICD-10-CM | POA: Diagnosis not present

## 2020-01-06 DIAGNOSIS — J90 Pleural effusion, not elsewhere classified: Secondary | ICD-10-CM | POA: Diagnosis present

## 2020-01-06 DIAGNOSIS — Z79899 Other long term (current) drug therapy: Secondary | ICD-10-CM

## 2020-01-06 DIAGNOSIS — D509 Iron deficiency anemia, unspecified: Secondary | ICD-10-CM | POA: Diagnosis present

## 2020-01-06 DIAGNOSIS — Z9119 Patient's noncompliance with other medical treatment and regimen: Secondary | ICD-10-CM | POA: Diagnosis not present

## 2020-01-06 DIAGNOSIS — R590 Localized enlarged lymph nodes: Secondary | ICD-10-CM | POA: Diagnosis present

## 2020-01-06 DIAGNOSIS — I700 Atherosclerosis of aorta: Secondary | ICD-10-CM | POA: Diagnosis present

## 2020-01-06 DIAGNOSIS — K567 Ileus, unspecified: Secondary | ICD-10-CM | POA: Diagnosis not present

## 2020-01-06 DIAGNOSIS — I471 Supraventricular tachycardia: Secondary | ICD-10-CM | POA: Diagnosis not present

## 2020-01-06 DIAGNOSIS — D75839 Thrombocytosis, unspecified: Secondary | ICD-10-CM | POA: Diagnosis present

## 2020-01-06 DIAGNOSIS — E43 Unspecified severe protein-calorie malnutrition: Secondary | ICD-10-CM | POA: Insufficient documentation

## 2020-01-06 DIAGNOSIS — E8889 Other specified metabolic disorders: Secondary | ICD-10-CM | POA: Diagnosis present

## 2020-01-06 DIAGNOSIS — F312 Bipolar disorder, current episode manic severe with psychotic features: Secondary | ICD-10-CM | POA: Diagnosis not present

## 2020-01-06 DIAGNOSIS — K449 Diaphragmatic hernia without obstruction or gangrene: Secondary | ICD-10-CM | POA: Diagnosis present

## 2020-01-06 DIAGNOSIS — R188 Other ascites: Secondary | ICD-10-CM | POA: Diagnosis present

## 2020-01-06 DIAGNOSIS — E860 Dehydration: Secondary | ICD-10-CM

## 2020-01-06 DIAGNOSIS — I313 Pericardial effusion (noninflammatory): Secondary | ICD-10-CM | POA: Diagnosis present

## 2020-01-06 DIAGNOSIS — E871 Hypo-osmolality and hyponatremia: Secondary | ICD-10-CM | POA: Diagnosis present

## 2020-01-06 DIAGNOSIS — R824 Acetonuria: Secondary | ICD-10-CM | POA: Diagnosis present

## 2020-01-06 DIAGNOSIS — Z681 Body mass index (BMI) 19 or less, adult: Secondary | ICD-10-CM

## 2020-01-06 DIAGNOSIS — F349 Persistent mood [affective] disorder, unspecified: Secondary | ICD-10-CM | POA: Diagnosis not present

## 2020-01-06 DIAGNOSIS — R456 Violent behavior: Secondary | ICD-10-CM | POA: Diagnosis not present

## 2020-01-06 DIAGNOSIS — K760 Fatty (change of) liver, not elsewhere classified: Secondary | ICD-10-CM | POA: Diagnosis present

## 2020-01-06 DIAGNOSIS — W19XXXA Unspecified fall, initial encounter: Secondary | ICD-10-CM | POA: Diagnosis not present

## 2020-01-06 DIAGNOSIS — R0902 Hypoxemia: Secondary | ICD-10-CM | POA: Diagnosis not present

## 2020-01-06 DIAGNOSIS — F39 Unspecified mood [affective] disorder: Secondary | ICD-10-CM | POA: Diagnosis not present

## 2020-01-06 DIAGNOSIS — I959 Hypotension, unspecified: Secondary | ICD-10-CM | POA: Diagnosis not present

## 2020-01-06 DIAGNOSIS — F3131 Bipolar disorder, current episode depressed, mild: Secondary | ICD-10-CM | POA: Diagnosis not present

## 2020-01-06 DIAGNOSIS — R4689 Other symptoms and signs involving appearance and behavior: Secondary | ICD-10-CM | POA: Diagnosis present

## 2020-01-06 DIAGNOSIS — Z20822 Contact with and (suspected) exposure to covid-19: Secondary | ICD-10-CM | POA: Diagnosis present

## 2020-01-06 DIAGNOSIS — D3A021 Benign carcinoid tumor of the cecum: Secondary | ICD-10-CM | POA: Diagnosis present

## 2020-01-06 DIAGNOSIS — E876 Hypokalemia: Secondary | ICD-10-CM | POA: Diagnosis present

## 2020-01-06 DIAGNOSIS — I4891 Unspecified atrial fibrillation: Secondary | ICD-10-CM | POA: Diagnosis present

## 2020-01-06 DIAGNOSIS — G4700 Insomnia, unspecified: Secondary | ICD-10-CM | POA: Diagnosis present

## 2020-01-06 DIAGNOSIS — F17210 Nicotine dependence, cigarettes, uncomplicated: Secondary | ICD-10-CM | POA: Diagnosis present

## 2020-01-06 DIAGNOSIS — R7989 Other specified abnormal findings of blood chemistry: Secondary | ICD-10-CM

## 2020-01-06 LAB — COMPREHENSIVE METABOLIC PANEL
ALT: 22 U/L (ref 0–44)
AST: 22 U/L (ref 15–41)
Albumin: 1.4 g/dL — ABNORMAL LOW (ref 3.5–5.0)
Alkaline Phosphatase: 110 U/L (ref 38–126)
Anion gap: 11 (ref 5–15)
BUN: 12 mg/dL (ref 6–20)
CO2: 28 mmol/L (ref 22–32)
Calcium: 7.3 mg/dL — ABNORMAL LOW (ref 8.9–10.3)
Chloride: 90 mmol/L — ABNORMAL LOW (ref 98–111)
Creatinine, Ser: 1 mg/dL (ref 0.61–1.24)
GFR, Estimated: >60 mL/min (ref >60)
Glucose, Bld: 87 mg/dL (ref 70–99)
Potassium: 2.7 mmol/L — CL (ref 3.5–5.1)
Sodium: 129 mmol/L — ABNORMAL LOW (ref 135–145)
Total Bilirubin: 0.8 mg/dL (ref 0.3–1.2)
Total Protein: 5.4 g/dL — ABNORMAL LOW (ref 6.5–8.1)

## 2020-01-06 LAB — URINALYSIS, ROUTINE W REFLEX MICROSCOPIC
Bilirubin Urine: NEGATIVE
Glucose, UA: NEGATIVE mg/dL
Hgb urine dipstick: NEGATIVE
Ketones, ur: NEGATIVE mg/dL
Leukocytes,Ua: NEGATIVE
Nitrite: NEGATIVE
Protein, ur: NEGATIVE mg/dL
Specific Gravity, Urine: 1.028 (ref 1.005–1.030)
pH: 5 (ref 5.0–8.0)

## 2020-01-06 LAB — CBC WITH DIFFERENTIAL/PLATELET
Abs Immature Granulocytes: 0.04 10*3/uL (ref 0.00–0.07)
Basophils Absolute: 0 10*3/uL (ref 0.0–0.1)
Basophils Relative: 0 %
Eosinophils Absolute: 0 10*3/uL (ref 0.0–0.5)
Eosinophils Relative: 0 %
HCT: 33.3 % — ABNORMAL LOW (ref 39.0–52.0)
Hemoglobin: 10.8 g/dL — ABNORMAL LOW (ref 13.0–17.0)
Immature Granulocytes: 1 %
Lymphocytes Relative: 41 %
Lymphs Abs: 2.6 10*3/uL (ref 0.7–4.0)
MCH: 29.6 pg (ref 26.0–34.0)
MCHC: 32.4 g/dL (ref 30.0–36.0)
MCV: 91.2 fL (ref 80.0–100.0)
Monocytes Absolute: 0.4 10*3/uL (ref 0.1–1.0)
Monocytes Relative: 6 %
Neutro Abs: 3.4 10*3/uL (ref 1.7–7.7)
Neutrophils Relative %: 52 %
Platelets: 441 10*3/uL — ABNORMAL HIGH (ref 150–400)
RBC: 3.65 MIL/uL — ABNORMAL LOW (ref 4.22–5.81)
RDW: 20.6 % — ABNORMAL HIGH (ref 11.5–15.5)
WBC: 6.4 10*3/uL (ref 4.0–10.5)
nRBC: 0 % (ref 0.0–0.2)

## 2020-01-06 LAB — RESPIRATORY PANEL BY RT PCR (FLU A&B, COVID)
Influenza A by PCR: NEGATIVE
Influenza B by PCR: NEGATIVE
SARS Coronavirus 2 by RT PCR: NEGATIVE

## 2020-01-06 LAB — PROTIME-INR
INR: 1.3 — ABNORMAL HIGH (ref 0.8–1.2)
Prothrombin Time: 15.4 seconds — ABNORMAL HIGH (ref 11.4–15.2)

## 2020-01-06 LAB — OSMOLALITY, URINE: Osmolality, Ur: 315 mOsm/kg (ref 300–900)

## 2020-01-06 LAB — SODIUM, URINE, RANDOM: Sodium, Ur: 23 mmol/L

## 2020-01-06 LAB — LACTIC ACID, PLASMA: Lactic Acid, Venous: 3.8 mmol/L (ref 0.5–1.9)

## 2020-01-06 MED ORDER — POTASSIUM CHLORIDE 10 MEQ/100ML IV SOLN
10.0000 meq | INTRAVENOUS | Status: DC
  Administered 2020-01-06 (×2): 10 meq via INTRAVENOUS
  Filled 2020-01-06 (×2): qty 100

## 2020-01-06 MED ORDER — ONDANSETRON HCL 4 MG/2ML IJ SOLN
4.0000 mg | Freq: Once | INTRAMUSCULAR | Status: AC
  Administered 2020-01-06: 4 mg via INTRAVENOUS
  Filled 2020-01-06: qty 2

## 2020-01-06 MED ORDER — ONDANSETRON HCL 4 MG/2ML IJ SOLN
4.0000 mg | Freq: Four times a day (QID) | INTRAMUSCULAR | Status: DC | PRN
  Administered 2020-01-07: 4 mg via INTRAVENOUS
  Filled 2020-01-06: qty 2

## 2020-01-06 MED ORDER — SODIUM CHLORIDE 0.9 % IV SOLN
INTRAVENOUS | Status: DC
  Administered 2020-01-07: 100 mL via INTRAVENOUS

## 2020-01-06 MED ORDER — IOHEXOL 300 MG/ML  SOLN
100.0000 mL | Freq: Once | INTRAMUSCULAR | Status: AC | PRN
  Administered 2020-01-06: 100 mL via INTRAVENOUS

## 2020-01-06 MED ORDER — POTASSIUM CHLORIDE 10 MEQ/100ML IV SOLN
10.0000 meq | INTRAVENOUS | Status: AC
  Administered 2020-01-06 – 2020-01-07 (×5): 10 meq via INTRAVENOUS
  Filled 2020-01-06 (×5): qty 100

## 2020-01-06 MED ORDER — SODIUM CHLORIDE 0.9 % IV BOLUS
1000.0000 mL | Freq: Once | INTRAVENOUS | Status: AC
  Administered 2020-01-06: 1000 mL via INTRAVENOUS

## 2020-01-06 NOTE — ED Notes (Signed)
This RN attempted to draw this pts second Lactic Acid Specimen with no success. Phlebotomy will be notified to attempt.

## 2020-01-06 NOTE — ED Triage Notes (Signed)
Pt states "I think I have a bowel obstruction".  When I asked him how he knows he has a bowel obstruction he called his mother and she states he is nauseated and hiccups and weight loss.  Pt abdomen is distended.

## 2020-01-06 NOTE — ED Notes (Signed)
Admitting Team at Bedside.
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
## 2020-01-06 NOTE — ED Notes (Signed)
Admitting Team and EDP Steinl made aware of pts concerns and request to not be admitted. Admitting Provider will come and speak with the pt regarding this. This RN was instructed to not place NG Tube until further notice. RN will continue to monitor.

## 2020-01-06 NOTE — ED Notes (Signed)
Pt transported to CT ?
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
## 2020-01-06 NOTE — ED Notes (Signed)
Patient transported to X-ray 

## 2020-01-06 NOTE — ED Notes (Signed)
EDP Steinl made aware of Potassium Levels; no new orders at this time.

## 2020-01-07 ENCOUNTER — Inpatient Hospital Stay (HOSPITAL_COMMUNITY): Payer: Medicaid Other

## 2020-01-07 DIAGNOSIS — E876 Hypokalemia: Secondary | ICD-10-CM

## 2020-01-07 DIAGNOSIS — F312 Bipolar disorder, current episode manic severe with psychotic features: Secondary | ICD-10-CM

## 2020-01-07 LAB — CBC
HCT: 28.7 % — ABNORMAL LOW (ref 39.0–52.0)
Hemoglobin: 9.5 g/dL — ABNORMAL LOW (ref 13.0–17.0)
MCH: 30.4 pg (ref 26.0–34.0)
MCHC: 33.1 g/dL (ref 30.0–36.0)
MCV: 92 fL (ref 80.0–100.0)
Platelets: 427 10*3/uL — ABNORMAL HIGH (ref 150–400)
RBC: 3.12 MIL/uL — ABNORMAL LOW (ref 4.22–5.81)
RDW: 20.6 % — ABNORMAL HIGH (ref 11.5–15.5)
WBC: 6.2 10*3/uL (ref 4.0–10.5)
nRBC: 0 % (ref 0.0–0.2)

## 2020-01-07 LAB — MAGNESIUM
Magnesium: 1.5 mg/dL — ABNORMAL LOW (ref 1.7–2.4)
Magnesium: 1.5 mg/dL — ABNORMAL LOW (ref 1.7–2.4)

## 2020-01-07 LAB — COMPREHENSIVE METABOLIC PANEL
ALT: 20 U/L (ref 0–44)
AST: 17 U/L (ref 15–41)
Albumin: 1.2 g/dL — ABNORMAL LOW (ref 3.5–5.0)
Alkaline Phosphatase: 90 U/L (ref 38–126)
Anion gap: 8 (ref 5–15)
BUN: 9 mg/dL (ref 6–20)
CO2: 28 mmol/L (ref 22–32)
Calcium: 6.9 mg/dL — ABNORMAL LOW (ref 8.9–10.3)
Chloride: 97 mmol/L — ABNORMAL LOW (ref 98–111)
Creatinine, Ser: 0.76 mg/dL (ref 0.61–1.24)
GFR, Estimated: >60 mL/min (ref >60)
Glucose, Bld: 88 mg/dL (ref 70–99)
Potassium: 3.3 mmol/L — ABNORMAL LOW (ref 3.5–5.1)
Sodium: 133 mmol/L — ABNORMAL LOW (ref 135–145)
Total Bilirubin: 0.9 mg/dL (ref 0.3–1.2)
Total Protein: 4.6 g/dL — ABNORMAL LOW (ref 6.5–8.1)

## 2020-01-07 LAB — C-REACTIVE PROTEIN: CRP: 6.4 mg/dL — ABNORMAL HIGH (ref <1.0)

## 2020-01-07 LAB — PHOSPHORUS
Phosphorus: 2.5 mg/dL (ref 2.5–4.6)
Phosphorus: 2.5 mg/dL (ref 2.5–4.6)

## 2020-01-07 LAB — LACTIC ACID, PLASMA: Lactic Acid, Venous: 1.1 mmol/L (ref 0.5–1.9)

## 2020-01-07 LAB — OSMOLALITY: Osmolality: 275 mOsm/kg (ref 275–295)

## 2020-01-07 LAB — HIV ANTIBODY (ROUTINE TESTING W REFLEX): HIV Screen 4th Generation wRfx: NONREACTIVE

## 2020-01-07 LAB — SEDIMENTATION RATE: Sed Rate: 11 mm/hr (ref 0–16)

## 2020-01-07 MED ORDER — MAGNESIUM SULFATE 2 GM/50ML IV SOLN
2.0000 g | Freq: Once | INTRAVENOUS | Status: AC
  Administered 2020-01-07: 2 g via INTRAVENOUS
  Filled 2020-01-07: qty 50

## 2020-01-07 NOTE — ED Notes (Signed)
Pt states he is agreeable to a placing NG tube for a short period of time.
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
## 2020-01-07 NOTE — ED Notes (Signed)
First contact. Change of shift. Pt assisted to restroom and then back to ED bed. Pt with "watery" unsubstantial BM. Pt still refusing NG placement. Asking for food. Family at bedside. Otherwise NAD. Will continue to monitor.

## 2020-01-07 NOTE — ED Notes (Signed)
MD at bedside. 

## 2020-01-07 NOTE — ED Notes (Signed)
Patient pulled out IV.

## 2020-01-07 NOTE — Consult Note (Signed)
  Psychiatric consult place due to patient lacking insight, capacity evaluation.  Patient remains in the ED at this time, procedure in place.  It appears patient has agreed to have NG tube place.  At this time patient remains n.p.o. due to his current medical status.  If patient becomes agitated will consider IV Haldol and or IV benzodiazepine Ativan.  Will attempt to reassess patient once he is admitted to the floor.

## 2020-01-07 NOTE — ED Notes (Signed)
Pt states, "I want this tube out of my nose now." Removed NG tube per patient request. 251ml bilious drainage out. Relayed to MD Shon Baton.Pt resting in bed. NADN

## 2020-01-07 NOTE — ED Notes (Signed)
Report to 2w nursing staff

## 2020-01-07 NOTE — ED Notes (Signed)
Pt states he is agreeable to placing NG tube for a short period of time. Lovena Le PA notified and is also agreeable. 77fr NG tube placed, marked and anchored at the left nare. Pt tolerated procedure w/o significant complaint. Awaiting Xray confirmation of placement.

## 2020-01-07 NOTE — ED Notes (Signed)
NG tube to [facility]WS

## 2020-01-07 NOTE — ED Notes (Signed)
Advanced NG tube 10cm per radiologist suggestion and marked. Pt states, "I want this out in 30 minutes."
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
## 2020-01-08 ENCOUNTER — Inpatient Hospital Stay (HOSPITAL_COMMUNITY): Payer: Medicaid Other

## 2020-01-08 DIAGNOSIS — F349 Persistent mood [affective] disorder, unspecified: Secondary | ICD-10-CM

## 2020-01-08 DIAGNOSIS — K56609 Unspecified intestinal obstruction, unspecified as to partial versus complete obstruction: Secondary | ICD-10-CM | POA: Diagnosis not present

## 2020-01-08 DIAGNOSIS — R456 Violent behavior: Secondary | ICD-10-CM

## 2020-01-08 DIAGNOSIS — F319 Bipolar disorder, unspecified: Secondary | ICD-10-CM

## 2020-01-08 LAB — BASIC METABOLIC PANEL
Anion gap: 9 (ref 5–15)
Anion gap: 9 (ref 5–15)
BUN: 12 mg/dL (ref 6–20)
BUN: 13 mg/dL (ref 6–20)
CO2: 22 mmol/L (ref 22–32)
CO2: 21 mmol/L — ABNORMAL LOW (ref 22–32)
Calcium: 7 mg/dL — ABNORMAL LOW (ref 8.9–10.3)
Calcium: 7 mg/dL — ABNORMAL LOW (ref 8.9–10.3)
Chloride: 102 mmol/L (ref 98–111)
Chloride: 102 mmol/L (ref 98–111)
Creatinine, Ser: 0.83 mg/dL (ref 0.61–1.24)
Creatinine, Ser: 0.76 mg/dL (ref 0.61–1.24)
GFR, Estimated: >60 mL/min (ref >60)
GFR, Estimated: >60 mL/min (ref >60)
Glucose, Bld: 80 mg/dL (ref 70–99)
Glucose, Bld: 98 mg/dL (ref 70–99)
Potassium: 3.5 mmol/L (ref 3.5–5.1)
Potassium: 4.1 mmol/L (ref 3.5–5.1)
Sodium: 133 mmol/L — ABNORMAL LOW (ref 135–145)
Sodium: 132 mmol/L — ABNORMAL LOW (ref 135–145)

## 2020-01-08 LAB — MAGNESIUM
Magnesium: 1.7 mg/dL (ref 1.7–2.4)
Magnesium: 2.1 mg/dL (ref 1.7–2.4)

## 2020-01-08 LAB — PHOSPHORUS: Phosphorus: 3.1 mg/dL (ref 2.5–4.6)

## 2020-01-08 MED ORDER — KCL IN DEXTROSE-NACL 40-5-0.9 MEQ/L-%-% IV SOLN
INTRAVENOUS | Status: DC
  Administered 2020-01-09: 1 mL via INTRAVENOUS
  Filled 2020-01-08 (×5): qty 1000

## 2020-01-08 MED ORDER — ORAL CARE MOUTH RINSE
15.0000 mL | Freq: Two times a day (BID) | OROMUCOSAL | Status: DC
  Administered 2020-01-08 – 2020-01-13 (×10): 15 mL via OROMUCOSAL

## 2020-01-08 MED ORDER — POTASSIUM CHLORIDE 10 MEQ/100ML IV SOLN: 10.0000 meq | INTRAVENOUS | Status: DC

## 2020-01-08 MED ORDER — MAGNESIUM SULFATE 2 GM/50ML IV SOLN
2.0000 g | Freq: Once | INTRAVENOUS | Status: AC
  Administered 2020-01-08: 2 g via INTRAVENOUS
  Filled 2020-01-08: qty 50

## 2020-01-08 MED ORDER — CHLORHEXIDINE GLUCONATE 0.12 % MT SOLN
15.0000 mL | Freq: Two times a day (BID) | OROMUCOSAL | Status: DC
  Administered 2020-01-08 – 2020-01-15 (×12): 15 mL via OROMUCOSAL
  Filled 2020-01-08 (×7): qty 15

## 2020-01-08 NOTE — Plan of Care (Signed)
  Problem: Education: Goal: Knowledge of General Education information will improve Description: Including pain rating scale, medication(s)/side effects and non-pharmacologic comfort measures 01/08/2020 0134 by Burnice Logan, LPN Outcome: Progressing 01/08/2020 0134 by Burnice Logan, LPN Outcome: Progressing 01/08/2020 0133 by Burnice Logan, LPN Outcome: Progressing   Problem: Clinical Measurements: Goal: Will remain free from infection 01/08/2020 0134 by Burnice Logan, LPN Outcome: Progressing 01/08/2020 0134 by Burnice Logan, LPN Outcome: Progressing 01/08/2020 0133 by Burnice Logan, LPN Outcome: Progressing
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
## 2020-01-08 NOTE — Progress Notes (Signed)
Pt's mother came to visit, and spoke with pt about moving forward in getting better. Pt decided that he is willing to be compliant with whatever the MD and his recommendations with NG tube ans surgery if needed. MD notified.

## 2020-01-08 NOTE — Progress Notes (Signed)
HD#2 Subjective:   Overnight Events: No acute events overnight.  Patient evaluated at bedside during rounds. He states he is doing well except feeling hungry and thirsty. Denies abdominal pain, nausea, or vomiting. Reports diarrhea. [place] and graham crackers had been placed at bedside, and when this provider picked them up to remove from the room, the patient became very focused on wanting to have them back. Explained to patient that we want to keep him from eating and drinking aside from ice chips in order to prevent worsening of his symptoms. The patient became more agitated however eventually acquiesced.   Objective:   Vital signs in last 24 hours: Vitals:   01/07/20 1615 01/07/20 1645 01/07/20 2049 01/08/20 0507  BP:  93/66 101/76 (!) 86/56  Pulse: (!) 106 (!) 108 (!) 108 98  Resp: 14 16 18 18   Temp:  98.5 F (36.9 C) 98.2 F (36.8 C) 98.2 F (36.8 C)  TempSrc:  Oral Oral Oral  SpO2: 99% 98% 100% 100%  Weight:        Physical Exam Constitutional: appears stated age, lying in bed in no acute distress, becomes agitated when soda and graham crackers are removed from the room Cardiovascular: regular rate and rhythm, no m/r/g Pulmonary/Chest: normal work of breathing on room air, lungs clear to auscultation bilaterally Abdominal: distended but less so compared to yesterday, non-tender, bowel sounds are less active today  Pertinent Labs: CBC Latest Ref Rng & Units 01/07/2020 01/06/2020 10/20/2019  WBC 4.0 - 10.5 K/uL 6.2 6.4 6.5  Hemoglobin 13.0 - 17.0 g/dL 9.5(L) 10.8(L) 15.6  Hematocrit 39 - 52 % 28.7(L) 33.3(L) 48.0  Platelets 150 - 400 K/uL 427(H) 441(H) 444(H)    CMP Latest Ref Rng & Units 01/08/2020 01/07/2020 01/06/2020  Glucose 70 - 99 mg/dL 80 88 87  BUN 6 - 20 mg/dL 12 9 12   Creatinine 0.61 - 1.24 mg/dL 0.83 0.76 1.00  Sodium 135 - 145 mmol/L 133(L) 133(L) 129(L)  Potassium 3.5 - 5.1 mmol/L 3.5 3.3(L) 2.7(LL)  Chloride 98 - 111 mmol/L 102 97(L) 90(L)  CO2 22  - 32 mmol/L 22 28 28   Calcium 8.9 - 10.3 mg/dL 7.0(L) 6.9(L) 7.3(L)  Total Protein 6.5 - 8.1 g/dL - 4.6(L) 5.4(L)  Total Bilirubin 0.3 - 1.2 mg/dL - 0.9 0.8  Alkaline Phos 38 - 126 U/L - 90 110  AST 15 - 41 U/L - 17 22  ALT 0 - 44 U/L - 20 22    Imaging: DG Abd 1 View  Result Date: 01/07/2020 CLINICAL DATA:  NG placement EXAM: ABDOMEN - 1 VIEW COMPARISON:  01/07/2020 FINDINGS: NG tip in the region of the gastric fundus. Side hole distal esophagus. Diffuse bowel dilatation appears improved with less small bowel dilatation. There remains a large amount of colonic gas. IMPRESSION: NG tip in the gastric fundus with the side hole in the esophagus. Recommend advancing 10 cm. Improved bowel dilatation. Electronically Signed   By: Franchot Gallo M.D.   On: 01/07/2020 12:50   DG Abd Portable 1V  Result Date: 01/07/2020 CLINICAL DATA:  Small bowel obstruction. EXAM: PORTABLE ABDOMEN - 1 VIEW COMPARISON:  May 09, 2019. FINDINGS: Increased small bowel dilatation is noted concerning for worsening distal small bowel obstruction. No colonic dilatation is noted. No abnormal calcifications are noted. IMPRESSION: Increased small bowel dilatation is noted concerning for worsening distal small bowel obstruction. Electronically Signed   By: Marijo Conception M.D.   On: 01/07/2020 10:25    Assessment/Plan:  Principal Problem:   SBO (small bowel obstruction) (HCC) Active Problems:   Mood disorder (HCC)   Bipolar disorder, unspecified ([place])   Aggressive behavior   Patient Summary: Tyler Dominguez is a 39 y.o. male with a pertinent PMH of SBO and bipolar disorder with pyschosis who presented with one week of abdominal cramping, distension and nausea and was admitted for SBO. Per chart review, he has had numerous past visits for SBO but left AMA prior to significant eval/treatment.   This is hospital day 2.  Small bowel obstruction Patient amenable to NG tube placement and LIWS yesterday for 2 hours.  200 cc of bilious drainage out. This morning, his abdomen is distended however improved from yesterday, non-tender, bowel sounds no longer as high pitched and tinkling. Continues to ask for PO intake, refusing NGT or surgery. Reevaluated by surgery today. No emergent surgical intervention warranted currently, still recommend NGT placement, ice chips only, will reevaluate Monday. Patient seen by psychiatry yesterday who recommended meds for agitation however planned for reevaluation for competency once patient on floor. Differential diagnosis continues to include scarring, adhesion or carcinoid tumor. Urine 5-HIAA, chromogranin, fecal calprotectin pending. - Surgery on board, appreciate recommendations - Will attempt to convince patient to have NG tube placed again - NPO except for ice chips - mIVF NS 100 cc/hr  Mood disorder Bipolar disorder with psychosis Aggressive behavior Psych consult obtained on admission to determine if patient is capable of making his own medical decisions.  Per psych NP states will attempt to reassess patient once he is admitted to the floor. Also stated if patient becomes agitated cycle consider IV Haldol or IV benzodiazepine. - Psych capacity evaluation pending - Consider IV Haldol or IV benzodiazepine if patient becomes agitated, per psych  Hypokalemic Hypomagnesia Mg 1.7 this morning. - Replenish to Mg >2 and K>4  Diet: NPO except ice chips IVF: NS,100cc/hr VTE: SCDs Code: Full   Dispo: Anticipated discharge to Home pending clinical improvement.   Please contact the on call pager after 5 pm and on weekends at [phone].  Alexandria Lodge, MD PGY-1 Internal Medicine Teaching Service Pager: [phone] 01/08/2020

## 2020-01-09 LAB — CALCIUM, IONIZED: Calcium, Ionized, Serum: 4.4 mg/dL — ABNORMAL LOW (ref 4.5–5.6)

## 2020-01-09 LAB — MAGNESIUM: Magnesium: 1.9 mg/dL (ref 1.7–2.4)

## 2020-01-09 LAB — PHOSPHORUS: Phosphorus: 2.2 mg/dL — ABNORMAL LOW (ref 2.5–4.6)

## 2020-01-09 MED ORDER — POTASSIUM PHOSPHATES 15 MMOLE/5ML IV SOLN
20.0000 mmol | Freq: Once | INTRAVENOUS | Status: AC
  Administered 2020-01-09: 20 mmol via INTRAVENOUS
  Filled 2020-01-09: qty 6.67

## 2020-01-09 NOTE — Social Work (Signed)
CSW spoke with pt after speaking with pt's mother. Pt states he is willing to get the surgery but not the NG tube. Pt stated he was hungry and hasn't been able to eat for four days. Pt's mom states that she has told her son that he needs to follow the direction of MD. Pt's mom also request MD to contact her in regards to pt status, MD made aware. Pt's mom stated she is in the process of trying to obtain guardianship but she wasn't aware of the cost associated.

## 2020-01-09 NOTE — Progress Notes (Signed)
Attempted to place NG tube x2, with ABD x-ray review, both tubes coiled and not in place. Pt agitated and refusing additional attempts tonight. Informed Dr. Heber Sycamore, ok to wait until AM. Will continue to monitor

## 2020-01-09 NOTE — Progress Notes (Addendum)
HD#3 Subjective:   Overnight Events: Attempted to place NG tube x2, but at times tubes coiled and not in place.  Patient refused additional attempts.  Patient evaluated at the bedside this morning.  Denies any abdominal pain, nausea or vomiting.  States he is very hungry.  Refusing additional attempts of NG tube.  He reports he does not want surgery at this time.  Continuously reported he is hungry and wants to eat.   Objective:   Vital signs in last 24 hours: Vitals:   01/08/20 0750 01/08/20 1540 01/08/20 2139 01/09/20 0553  BP: 91/69 93/69 93/72  97/67  Pulse: 81 87 95 92  Resp: 17 17 20 18   Temp: 97.6 F (36.4 C) (!) 97.4 F (36.3 C) 97.9 F (36.6 C) 98 F (36.7 C)  TempSrc:      SpO2: 100% 99% 100% 100%  Weight:        Physical Exam Constitutional: appears stated age, lying in bed in no acute distress CV: regular rate and rhythm, no m/r/g Lungs: normal work of breathing on room air, lungs clear to auscultation bilaterally Abdominal: distended, non-tender, bowel sounds are less active today  Pertinent Labs: CBC Latest Ref Rng & Units 01/07/2020 01/06/2020 10/20/2019  WBC 4.0 - 10.5 K/uL 6.2 6.4 6.5  Hemoglobin 13.0 - 17.0 g/dL 9.5(L) 10.8(L) 15.6  Hematocrit 39 - 52 % 28.7(L) 33.3(L) 48.0  Platelets 150 - 400 K/uL 427(H) 441(H) 444(H)    CMP Latest Ref Rng & Units 01/08/2020 01/08/2020 01/07/2020  Glucose 70 - 99 mg/dL 98 80 88  BUN 6 - 20 mg/dL 13 12 9   Creatinine 0.61 - 1.24 mg/dL 0.76 0.83 0.76  Sodium 135 - 145 mmol/L 132(L) 133(L) 133(L)  Potassium 3.5 - 5.1 mmol/L 4.1 3.5 3.3(L)  Chloride 98 - 111 mmol/L 102 102 97(L)  CO2 22 - 32 mmol/L 21(L) 22 28  Calcium 8.9 - 10.3 mg/dL 7.0(L) 7.0(L) 6.9(L)  Total Protein 6.5 - 8.1 g/dL - - 4.6(L)  Total Bilirubin 0.3 - 1.2 mg/dL - - 0.9  Alkaline Phos 38 - 126 U/L - - 90  AST 15 - 41 U/L - - 17  ALT 0 - 44 U/L - - 20     Assessment/Plan:   Principal Problem:   SBO (small bowel obstruction) (HCC) Active  Problems:   Mood disorder (HCC)   Bipolar disorder, unspecified ([place])   Aggressive behavior   Patient Summary: Tyler Dominguez is a 39 y.o. male with a pertinent PMH of SBO and bipolar disorder with pyschosis who presented with one week of abdominal cramping, distension and nausea and was admitted for SBO. Per chart review, he has had numerous past visits for SBO but left AMA prior to significant eval/treatment.   This is hospital day 3.  Small bowel obstruction Unsuccessfully attempted NG tube placement last night x2.  Patient denies any further attempts of tube placement at this time.  He is currently also denying any surgical interventions.  Continues to remain distended on exam with bowel sounds less active than before.  Continues to ask for p.o. intake.  No emergent surgical intervention currently warranted; however surgery reports that patient may benefit from diagnostic laparoscopy and possible surgical resection of mass and small bowel mesentery to prevent recurrent small bowel obstructions. Also continue to recommend NG tube placement and will reevaluate Monday.  Psychiatry evaluated patient and recommended considering guardianship. - Urine 5-HIAA, chromogranin, fecal calprotectin pending. - Surgery on board, appreciate recommendations - Will attempt  to convince patient to have NG tube placed again - NPO except for ice chips - IVF NS 100 cc/hr  Mood disorder Bipolar disorder with psychosis Aggressive behavior Psych evaluated patient and recommended to determine if patient has medical power of attorney and if not to consider guardianship.  TOC consulted for competency/guardianship. - Consider IV Haldol or IV benzodiazepine if patient becomes agitated, per psych  Hypokalemic Hypomagnesia Hypophosphatemia  Mg 1. this morning.  Phosphorus 2.2. - 20 mmol IV K-Phos ordered - Replenish to Mg >2 and K>4  Diet: NPO except ice chips IVF: NS,100cc/hr VTE: SCDs Code: Full   Dispo:  Anticipated discharge to Home pending clinical improvement.   Please contact the on call pager after 5 pm and on weekends at [phone].  Alexandria Lodge, MD PGY-1 Internal Medicine Teaching Service Pager: [phone] 01/09/2020
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
## 2020-01-10 DIAGNOSIS — F3131 Bipolar disorder, current episode depressed, mild: Secondary | ICD-10-CM | POA: Diagnosis not present

## 2020-01-10 LAB — RENAL FUNCTION PANEL
Albumin: <1 g/dL — ABNORMAL LOW (ref 3.5–5.0)
Anion gap: 5 (ref 5–15)
BUN: 13 mg/dL (ref 6–20)
CO2: 25 mmol/L (ref 22–32)
Calcium: 7 mg/dL — ABNORMAL LOW (ref 8.9–10.3)
Chloride: 104 mmol/L (ref 98–111)
Creatinine, Ser: 0.66 mg/dL (ref 0.61–1.24)
GFR, Estimated: >60 mL/min (ref >60)
Glucose, Bld: 89 mg/dL (ref 70–99)
Phosphorus: 2.6 mg/dL (ref 2.5–4.6)
Potassium: 4 mmol/L (ref 3.5–5.1)
Sodium: 134 mmol/L — ABNORMAL LOW (ref 135–145)

## 2020-01-10 LAB — MAGNESIUM: Magnesium: 1.6 mg/dL — ABNORMAL LOW (ref 1.7–2.4)

## 2020-01-10 LAB — CHROMOGRANIN A: Chromogranin A (ng/mL): 145.3 ng/mL — ABNORMAL HIGH (ref 0.0–101.8)

## 2020-01-10 MED ORDER — MAGNESIUM SULFATE 2 GM/50ML IV SOLN
2.0000 g | Freq: Once | INTRAVENOUS | Status: AC
  Administered 2020-01-10: 2 g via INTRAVENOUS
  Filled 2020-01-10: qty 50

## 2020-01-10 MED ORDER — KCL IN DEXTROSE-NACL 20-5-0.45 MEQ/L-%-% IV SOLN
INTRAVENOUS | Status: DC
  Administered 2020-01-10: 1 mL via INTRAVENOUS
  Filled 2020-01-10 (×5): qty 1000

## 2020-01-10 NOTE — Progress Notes (Signed)
HD#4 Subjective:   Overnight, patient asked repeatedly for food. Remained NPO except for ice chips.   Evaluated at bedside during rounds. Continues to report hunger and asks repeatedly for food. Patient reports having loose stools. Per nursing, bowel movement this morning was large and loose. Denies abdominal pain but reports it is "growling like stink." Denies nausea, vomiting, or abdominal tightness. Asks if he would be put to sleep if he had surgery, and when told yes, he states he would like to be put to sleep since he doesn't sleep well at night. Refuses NG tube placement.  States he will continue to think about surgery and he will think about it tomorrow.   Called patient's mother who reports she is planning on taking papers and money for guardianship this week but she isn't sure when she would be seen by a judge for the process. She reports the patient is now interested in the surgery and told her it was planned for tomorrow. Explained to patient's mother that since the patient was previously deemed to not have capacity, in order for him to make that decision for himself we would need psychiatric evaluation again.   Objective:   Vital signs in last 24 hours: Vitals:   01/09/20 0553 01/09/20 1436 01/09/20 2026 01/10/20 0529  BP: 97/67 92/65 (!) 98/57 92/60  Pulse: 92 91 90 93  Resp: 18 18 18 18   Temp: 98 F (36.7 C) 98.1 F (36.7 C) 98 F (36.7 C) 98 F (36.7 C)  TempSrc:   Oral Oral  SpO2: 100% 98% 99% 99%  Weight:        Physical Exam Constitutional: Patient appears comfortable, lying in bed in no acute distress CV: regular rate and rhythm, no m/r/g, no lower extremity edema Lungs: normal work of breathing on room air, lungs clear to auscultation bilaterally Abdominal: distended, mildly tender, bowel sounds continue to be hypoactive  Pertinent Labs: CBC Latest Ref Rng & Units 01/07/2020 01/06/2020 10/20/2019  WBC 4.0 - 10.5 K/uL 6.2 6.4 6.5  Hemoglobin 13.0 - 17.0 g/dL  9.5(L) 10.8(L) 15.6  Hematocrit 39 - 52 % 28.7(L) 33.3(L) 48.0  Platelets 150 - 400 K/uL 427(H) 441(H) 444(H)    CMP Latest Ref Rng & Units 01/10/2020 01/08/2020 01/08/2020  Glucose 70 - 99 mg/dL 89 98 80  BUN 6 - 20 mg/dL 13 13 12   Creatinine 0.61 - 1.24 mg/dL 0.66 0.76 0.83  Sodium 135 - 145 mmol/L 134(L) 132(L) 133(L)  Potassium 3.5 - 5.1 mmol/L 4.0 4.1 3.5  Chloride 98 - 111 mmol/L 104 102 102  CO2 22 - 32 mmol/L 25 21(L) 22  Calcium 8.9 - 10.3 mg/dL 7.0(L) 7.0(L) 7.0(L)  Total Protein 6.5 - 8.1 g/dL - - -  Total Bilirubin 0.3 - 1.2 mg/dL - - -  Alkaline Phos 38 - 126 U/L - - -  AST 15 - 41 U/L - - -  ALT 0 - 44 U/L - - -   Phos: 2.6 Mg: 1.6  Assessment/Plan:   Principal Problem:   SBO (small bowel obstruction) (HCC) Active Problems:   Mood disorder (HCC)   Bipolar disorder, unspecified ([place])   Aggressive behavior   Patient Summary: Tyler Dominguez is a 39 y.o. male with a pertinent PMH of SBO and bipolar disorder with pyschosis who presented with one week of abdominal cramping, distension and nausea and was admitted for SBO. Per chart review, he has had numerous past visits for SBO but left AMA prior to  significant eval/treatment.   This is hospital day 4.  Small bowel obstruction Continues to remain distended on exam with hypoactive bowel sounds. Continues to ask for p.o. intake. On our evaluation this morning, patient states he would be willing to get surgery however denies any further attempts of tube placement at this time. He told surgery this morning he would need to think about it. Per his mother, patient is now amenable to surgery. Explained to mother that patient would have to be deemed to have capacity in order to proceed with surgery without her guardianship. - Re-consult to psychiatry for capacity evaluation - Urine 5-HIAA, chromogranin, fecal calprotectin pending - Surgery on board, appreciate recommendations - Will continue to try and convince patient to  have NG tube placed again - NPO except for ice chips - D5 1/2 NS with KCl 20 mEq @ 100 cc/hr  Mood disorder Bipolar disorder with psychosis Aggressive behavior Per psych eval on 10/23, patient does not have capacity to make an informed medical decision. They note capacity may change from day-to-day. Patient's mother currently working on obtaining guardianship. Will re-consult psych to reevaluate capacity. - Re-consult to psychiatry for capacity evaluation - Consider IV Haldol or IV benzodiazepine if patient becomes agitated, per psych  Hypokalemic Hypomagnesia Hypophosphatemia  Mg 1.6 this morning.  Phosphorus 2.6. - 20 mmol IV K-Phos ordered - Replenish to Mg >2 and K>4   Diet: NPO except ice chips IVF: D5 1/2 NS with KCl 20 mEq @ 100 cc/hr VTE: SCDs Code: Full   Dispo: Anticipated discharge to Home pending clinical improvement.   Please contact the on call pager after 5 pm and on weekends at [phone].  Alexandria Lodge, MD PGY-1 Internal Medicine Teaching Service Pager: [phone] 01/10/2020
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
## 2020-01-10 NOTE — Plan of Care (Signed)
  Problem: Education: Goal: Knowledge of General Education information will improve Description: Including pain rating scale, medication(s)/side effects and non-pharmacologic comfort measures Outcome: Progressing   Problem: Clinical Measurements: Goal: Will remain free from infection Outcome: Progressing   Problem: Nutrition: Goal: Adequate nutrition will be maintained Outcome: Progressing   
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
## 2020-01-11 ENCOUNTER — Encounter (HOSPITAL_COMMUNITY)
Admission: EM | Disposition: A | Discharge status: Home Health | Payer: Self-pay | Source: Home / Self Care | Attending: Internal Medicine

## 2020-01-11 ENCOUNTER — Inpatient Hospital Stay (HOSPITAL_COMMUNITY): Payer: Medicaid Other | Admitting: Certified Registered Nurse Anesthetist

## 2020-01-11 ENCOUNTER — Encounter (HOSPITAL_COMMUNITY): Payer: Self-pay | Admitting: Internal Medicine

## 2020-01-11 ENCOUNTER — Inpatient Hospital Stay: Payer: Self-pay

## 2020-01-11 HISTORY — PX: BOWEL RESECTION: SHX1257

## 2020-01-11 HISTORY — PX: LAPAROTOMY: SHX154

## 2020-01-11 LAB — CBC WITH DIFFERENTIAL/PLATELET
Abs Immature Granulocytes: 0 10*3/uL (ref 0.00–0.07)
Basophils Absolute: 0 10*3/uL (ref 0.0–0.1)
Basophils Relative: 0 %
Eosinophils Absolute: 0 10*3/uL (ref 0.0–0.5)
Eosinophils Relative: 0 %
HCT: 28.9 % — ABNORMAL LOW (ref 39.0–52.0)
Hemoglobin: 9.1 g/dL — ABNORMAL LOW (ref 13.0–17.0)
Lymphocytes Relative: 11 %
Lymphs Abs: 0.6 10*3/uL — ABNORMAL LOW (ref 0.7–4.0)
MCH: 31 pg (ref 26.0–34.0)
MCHC: 31.5 g/dL (ref 30.0–36.0)
MCV: 98.3 fL (ref 80.0–100.0)
Monocytes Absolute: 0.2 10*3/uL (ref 0.1–1.0)
Monocytes Relative: 4 %
Neutro Abs: 4.6 10*3/uL (ref 1.7–7.7)
Neutrophils Relative %: 85 %
Platelets: 467 10*3/uL — ABNORMAL HIGH (ref 150–400)
RBC: 2.94 MIL/uL — ABNORMAL LOW (ref 4.22–5.81)
RDW: 20.2 % — ABNORMAL HIGH (ref 11.5–15.5)
WBC: 5.4 10*3/uL (ref 4.0–10.5)
nRBC: 0.6 % — ABNORMAL HIGH (ref 0.0–0.2)
nRBC: 2 /100 WBC — ABNORMAL HIGH

## 2020-01-11 LAB — COMPREHENSIVE METABOLIC PANEL
ALT: 16 U/L (ref 0–44)
AST: 19 U/L (ref 15–41)
Albumin: 1.9 g/dL — ABNORMAL LOW (ref 3.5–5.0)
Alkaline Phosphatase: 68 U/L (ref 38–126)
Anion gap: 10 (ref 5–15)
BUN: 10 mg/dL (ref 6–20)
CO2: 20 mmol/L — ABNORMAL LOW (ref 22–32)
Calcium: 7.4 mg/dL — ABNORMAL LOW (ref 8.9–10.3)
Chloride: 107 mmol/L (ref 98–111)
Creatinine, Ser: 0.78 mg/dL (ref 0.61–1.24)
GFR, Estimated: >60 mL/min (ref >60)
Glucose, Bld: 135 mg/dL — ABNORMAL HIGH (ref 70–99)
Potassium: 4.5 mmol/L (ref 3.5–5.1)
Sodium: 137 mmol/L (ref 135–145)
Total Bilirubin: 0.8 mg/dL (ref 0.3–1.2)
Total Protein: 4.3 g/dL — ABNORMAL LOW (ref 6.5–8.1)

## 2020-01-11 LAB — MAGNESIUM
Magnesium: 1.8 mg/dL (ref 1.7–2.4)
Magnesium: 1.7 mg/dL (ref 1.7–2.4)

## 2020-01-11 LAB — PHOSPHORUS: Phosphorus: 3.5 mg/dL (ref 2.5–4.6)

## 2020-01-11 LAB — CULTURE, BLOOD (ROUTINE X 2)
Culture: NO GROWTH
Culture: NO GROWTH

## 2020-01-11 LAB — GLUCOSE, CAPILLARY
Glucose-Capillary: 149 mg/dL — ABNORMAL HIGH (ref 70–99)
Glucose-Capillary: 202 mg/dL — ABNORMAL HIGH (ref 70–99)

## 2020-01-11 SURGERY — LAPAROTOMY, EXPLORATORY
Anesthesia: General | Site: Abdomen

## 2020-01-11 MED ORDER — CHLORHEXIDINE GLUCONATE 0.12 % MT SOLN: 15.0000 mL | Freq: Once | OROMUCOSAL | Status: DC

## 2020-01-11 MED ORDER — KCL IN DEXTROSE-NACL 20-5-0.45 MEQ/L-%-% IV SOLN
INTRAVENOUS | Status: DC
Start: 2020-01-11 — End: 2020-01-11

## 2020-01-11 MED ORDER — SODIUM CHLORIDE 0.9 % IV SOLN
1.0000 g | Freq: Once | INTRAVENOUS | Status: AC
  Administered 2020-01-11: 1 g via INTRAVENOUS
  Filled 2020-01-11: qty 1

## 2020-01-11 MED ORDER — ROCURONIUM BROMIDE 10 MG/ML (PF) SYRINGE
PREFILLED_SYRINGE | INTRAVENOUS | Status: DC | PRN
  Administered 2020-01-11 (×2): 50 mg via INTRAVENOUS

## 2020-01-11 MED ORDER — ENOXAPARIN SODIUM 40 MG/0.4ML ~~LOC~~ SOLN: 40.0000 mg | SUBCUTANEOUS | Status: DC

## 2020-01-11 MED ORDER — INSULIN ASPART 100 UNIT/ML ~~LOC~~ SOLN
0.0000 [IU] | SUBCUTANEOUS | Status: DC
  Administered 2020-01-13: 1 [IU] via SUBCUTANEOUS
  Filled 2020-01-11: qty 0.09

## 2020-01-11 MED ORDER — SODIUM CHLORIDE 0.9% FLUSH: 9.0000 mL | INTRAVENOUS | Status: DC | PRN

## 2020-01-11 MED ORDER — ORAL CARE MOUTH RINSE: 15.0000 mL | Freq: Once | OROMUCOSAL | Status: DC

## 2020-01-11 MED ORDER — DIPHENHYDRAMINE HCL 50 MG/ML IJ SOLN: 12.5000 mg | Freq: Four times a day (QID) | INTRAMUSCULAR | Status: DC | PRN

## 2020-01-11 MED ORDER — CHLORHEXIDINE GLUCONATE CLOTH 2 % EX PADS
6.0000 | MEDICATED_PAD | Freq: Every day | CUTANEOUS | Status: DC
  Administered 2020-01-12 – 2020-01-14 (×3): 6 via TOPICAL

## 2020-01-11 MED ORDER — PHENYLEPHRINE HCL-NACL 10-0.9 MG/250ML-% IV SOLN
INTRAVENOUS | Status: DC | PRN
  Administered 2020-01-11: 30 ug/min via INTRAVENOUS

## 2020-01-11 MED ORDER — LACTATED RINGERS IV BOLUS
1000.0000 mL | Freq: Once | INTRAVENOUS | Status: AC
  Administered 2020-01-11: 1000 mL via INTRAVENOUS

## 2020-01-11 MED ORDER — FENTANYL CITRATE (PF) 250 MCG/5ML IJ SOLN
INTRAMUSCULAR | Status: AC
  Filled 2020-01-11: qty 5

## 2020-01-11 MED ORDER — 0.9 % SODIUM CHLORIDE (POUR BTL) OPTIME
TOPICAL | Status: DC | PRN
  Administered 2020-01-11 (×3): 1000 mL

## 2020-01-11 MED ORDER — HYDROMORPHONE HCL 1 MG/ML IJ SOLN
0.2500 mg | INTRAMUSCULAR | Status: DC | PRN
  Administered 2020-01-11: 0.25 mg via INTRAVENOUS

## 2020-01-11 MED ORDER — DIPHENHYDRAMINE HCL 12.5 MG/5ML PO ELIX: 12.5000 mg | ORAL_SOLUTION | Freq: Four times a day (QID) | ORAL | Status: DC | PRN

## 2020-01-11 MED ORDER — SUGAMMADEX SODIUM 200 MG/2ML IV SOLN
INTRAVENOUS | Status: DC | PRN
  Administered 2020-01-11 (×2): 200 mg via INTRAVENOUS

## 2020-01-11 MED ORDER — FENTANYL CITRATE (PF) 250 MCG/5ML IJ SOLN
INTRAMUSCULAR | Status: DC | PRN
  Administered 2020-01-11: 100 ug via INTRAVENOUS

## 2020-01-11 MED ORDER — PROPOFOL 10 MG/ML IV BOLUS
INTRAVENOUS | Status: DC | PRN
  Administered 2020-01-11: 110 mg via INTRAVENOUS

## 2020-01-11 MED ORDER — SUCCINYLCHOLINE CHLORIDE 200 MG/10ML IV SOSY
PREFILLED_SYRINGE | INTRAVENOUS | Status: DC | PRN
  Administered 2020-01-11: 140 mg via INTRAVENOUS

## 2020-01-11 MED ORDER — SODIUM CHLORIDE 0.9 % IV SOLN
INTRAVENOUS | Status: AC
  Filled 2020-01-11: qty 1

## 2020-01-11 MED ORDER — KCL IN DEXTROSE-NACL 20-5-0.45 MEQ/L-%-% IV SOLN
INTRAVENOUS | Status: AC
  Filled 2020-01-11 (×3): qty 1000

## 2020-01-11 MED ORDER — MAGNESIUM SULFATE IN D5W 1-5 GM/100ML-% IV SOLN
1.0000 g | Freq: Once | INTRAVENOUS | Status: AC
  Administered 2020-01-11: 1 g via INTRAVENOUS
  Filled 2020-01-11: qty 100

## 2020-01-11 MED ORDER — NALOXONE HCL 0.4 MG/ML IJ SOLN: 0.4000 mg | INTRAMUSCULAR | Status: DC | PRN

## 2020-01-11 MED ORDER — HYDROMORPHONE HCL 1 MG/ML IJ SOLN
INTRAMUSCULAR | Status: AC
  Filled 2020-01-11: qty 1

## 2020-01-11 MED ORDER — METHOCARBAMOL 1000 MG/10ML IJ SOLN
500.0000 mg | Freq: Four times a day (QID) | INTRAVENOUS | Status: DC | PRN
  Filled 2020-01-11: qty 5

## 2020-01-11 MED ORDER — MIDAZOLAM HCL 2 MG/2ML IJ SOLN
INTRAMUSCULAR | Status: AC
  Filled 2020-01-11: qty 2

## 2020-01-11 MED ORDER — VASOPRESSIN 20 UNIT/ML IV SOLN
INTRAVENOUS | Status: DC | PRN
  Administered 2020-01-11 (×4): 2 [IU] via INTRAVENOUS
  Administered 2020-01-11: 1 [IU] via INTRAVENOUS

## 2020-01-11 MED ORDER — ACETAMINOPHEN 10 MG/ML IV SOLN
1000.0000 mg | Freq: Four times a day (QID) | INTRAVENOUS | Status: AC
  Administered 2020-01-11 – 2020-01-12 (×4): 1000 mg via INTRAVENOUS
  Filled 2020-01-11 (×6): qty 100

## 2020-01-11 MED ORDER — PROPOFOL 10 MG/ML IV BOLUS
INTRAVENOUS | Status: AC
  Filled 2020-01-11: qty 40

## 2020-01-11 MED ORDER — LACTATED RINGERS IV SOLN: INTRAVENOUS | Status: DC

## 2020-01-11 MED ORDER — MEPERIDINE HCL 25 MG/ML IJ SOLN: 6.2500 mg | INTRAMUSCULAR | Status: DC | PRN

## 2020-01-11 MED ORDER — LIDOCAINE 5 % EX PTCH
1.0000 | MEDICATED_PATCH | CUTANEOUS | Status: DC
  Administered 2020-01-11 – 2020-01-13 (×3): 1 via TRANSDERMAL
  Filled 2020-01-11 (×4): qty 1

## 2020-01-11 MED ORDER — ALBUMIN HUMAN 5 % IV SOLN: INTRAVENOUS | Status: DC | PRN

## 2020-01-11 MED ORDER — MIDAZOLAM HCL 5 MG/5ML IJ SOLN
INTRAMUSCULAR | Status: DC | PRN
  Administered 2020-01-11: 2 mg via INTRAVENOUS

## 2020-01-11 MED ORDER — MORPHINE SULFATE 2 MG/ML IV SOLN
INTRAVENOUS | Status: DC
  Administered 2020-01-11: 2 mg via INTRAVENOUS
  Filled 2020-01-11: qty 25
  Filled 2020-01-11: qty 20

## 2020-01-11 MED ORDER — ONDANSETRON HCL 4 MG/2ML IJ SOLN: 4.0000 mg | Freq: Once | INTRAMUSCULAR | Status: DC | PRN

## 2020-01-11 MED ORDER — LIDOCAINE 2% (20 MG/ML) 5 ML SYRINGE
INTRAMUSCULAR | Status: DC | PRN
  Administered 2020-01-11: 100 mg via INTRAVENOUS

## 2020-01-11 SURGICAL SUPPLY — 52 items
BLADE CLIPPER SURG (BLADE) IMPLANT
CANISTER SUCT 3000ML PPV (MISCELLANEOUS) ×3 IMPLANT
CHLORAPREP W/TINT 26 (MISCELLANEOUS) ×3 IMPLANT
CLIP VESOCCLUDE LG 6/CT (CLIP) ×3 IMPLANT
CLIP VESOCCLUDE MED 24/CT (CLIP) ×3 IMPLANT
COVER SURGICAL LIGHT HANDLE (MISCELLANEOUS) ×3 IMPLANT
COVER WAND RF STERILE (DRAPES) IMPLANT
DRAPE LAPAROSCOPIC ABDOMINAL (DRAPES) ×3 IMPLANT
DRAPE WARM FLUID 44X44 (DRAPES) ×3 IMPLANT
DRSG OPSITE POSTOP 4X10 (GAUZE/BANDAGES/DRESSINGS) ×3 IMPLANT
DRSG OPSITE POSTOP 4X8 (GAUZE/BANDAGES/DRESSINGS) IMPLANT
ELECT BLADE 6.5 EXT (BLADE) IMPLANT
ELECT CAUTERY BLADE 6.4 (BLADE) ×6 IMPLANT
ELECT REM PT RETURN 9FT ADLT (ELECTROSURGICAL) ×3
ELECTRODE REM PT RTRN 9FT ADLT (ELECTROSURGICAL) ×1 IMPLANT
GAUZE SPONGE 4X4 12PLY STRL (GAUZE/BANDAGES/DRESSINGS) ×3 IMPLANT
GLOVE BIO SURGEON STRL SZ7 (GLOVE) ×6 IMPLANT
GLOVE BIOGEL PI IND STRL 7.5 (GLOVE) ×1 IMPLANT
GLOVE BIOGEL PI INDICATOR 7.5 (GLOVE) ×2
GOWN STRL REUS W/ TWL LRG LVL3 (GOWN DISPOSABLE) ×3 IMPLANT
GOWN STRL REUS W/TWL LRG LVL3 (GOWN DISPOSABLE) ×9
HANDLE SUCTION POOLE (INSTRUMENTS) ×1 IMPLANT
KIT BASIN OR (CUSTOM PROCEDURE TRAY) ×3 IMPLANT
KIT TURNOVER KIT B (KITS) ×3 IMPLANT
LIGASURE IMPACT 36 18CM CVD LR (INSTRUMENTS) ×3 IMPLANT
NS IRRIG 1000ML POUR BTL (IV SOLUTION) ×6 IMPLANT
PACK GENERAL/GYN (CUSTOM PROCEDURE TRAY) ×3 IMPLANT
PAD ARMBOARD 7.5X6 YLW CONV (MISCELLANEOUS) ×3 IMPLANT
PENCIL SMOKE EVACUATOR (MISCELLANEOUS) ×3 IMPLANT
RELOAD PROXIMATE 75MM BLUE (ENDOMECHANICALS) ×6 IMPLANT
SPECIMEN JAR LARGE (MISCELLANEOUS) IMPLANT
SPONGE LAP 18X18 RF (DISPOSABLE) IMPLANT
STAPLER GUN LINEAR PROX 60 (STAPLE) ×3 IMPLANT
STAPLER PROXIMATE 55 BLUE (STAPLE) IMPLANT
STAPLER PROXIMATE 75MM BLUE (STAPLE) ×3 IMPLANT
STAPLER VISISTAT 35W (STAPLE) ×3 IMPLANT
SUCTION POOLE HANDLE (INSTRUMENTS) ×3
SUT PDS AB 1 TP1 96 (SUTURE) ×6 IMPLANT
SUT SILK 2 0 (SUTURE) ×3
SUT SILK 2 0 SH CR/8 (SUTURE) ×3 IMPLANT
SUT SILK 2 0 TIES 10X30 (SUTURE) ×3 IMPLANT
SUT SILK 2-0 18XBRD TIE 12 (SUTURE) ×1 IMPLANT
SUT SILK 3 0 (SUTURE) ×3
SUT SILK 3 0 SH CR/8 (SUTURE) ×3 IMPLANT
SUT SILK 3 0 TIES 10X30 (SUTURE) ×3 IMPLANT
SUT SILK 3-0 18XBRD TIE 12 (SUTURE) ×1 IMPLANT
SUT VIC AB 3-0 SH 18 (SUTURE) IMPLANT
TOWEL GREEN STERILE (TOWEL DISPOSABLE) ×3 IMPLANT
TOWEL GREEN STERILE FF (TOWEL DISPOSABLE) ×3 IMPLANT
TRAY FOLEY MTR SLVR 14FR STAT (SET/KITS/TRAYS/PACK) IMPLANT
TRAY FOLEY MTR SLVR 16FR STAT (SET/KITS/TRAYS/PACK) ×3 IMPLANT
YANKAUER SUCT BULB TIP NO VENT (SUCTIONS) ×3 IMPLANT

## 2020-01-11 NOTE — Anesthesia Preprocedure Evaluation (Signed)
Anesthesia Evaluation  Patient identified by MRN, date of birth, ID band Patient awake    Reviewed: Allergy & Precautions, NPO status , Patient's Chart, lab work & pertinent test results  Airway Mallampati: I  TM Distance: >3 FB Neck ROM: Full    Dental   Pulmonary Current Smoker and Patient abstained from smoking.,    Pulmonary exam normal        Cardiovascular Normal cardiovascular exam     Neuro/Psych Depression Bipolar Disorder Schizophrenia    GI/Hepatic   Endo/Other    Renal/GU      Musculoskeletal   Abdominal   Peds  Hematology   Anesthesia Other Findings   Reproductive/Obstetrics                             Anesthesia Physical Anesthesia Plan  ASA: III  Anesthesia Plan: General   Post-op Pain Management:    Induction: Intravenous  PONV Risk Score and Plan: Ondansetron  Airway Management Planned: Oral ETT  Additional Equipment:   Intra-op Plan:   Post-operative Plan: Extubation in OR  Informed Consent: I have reviewed the patients History and Physical, chart, labs and discussed the procedure including the risks, benefits and alternatives for the proposed anesthesia with the patient or authorized representative who has indicated his/her understanding and acceptance.       Plan Discussed with: CRNA and Surgeon  Anesthesia Plan Comments:         Anesthesia Quick Evaluation

## 2020-01-11 NOTE — Progress Notes (Signed)
HD#5 Subjective:   No acute events overnight.  Patient evaluated at bedside during rounds. Patient states he is "alright, just hungry." Asks if he can have food. Reports have had additional diarrhea. Denies abdominal pain or distension. Mother at bedside, asks when surgery may happen. Explained that surgical consent needs to be obtained by surgery.  Objective:   Vital signs in last 24 hours: Vitals:   01/09/20 2026 01/10/20 0529 01/10/20 1417 01/10/20 2119  BP: (!) 98/57 92/60 (!) 82/59 98/66  Pulse: 90 93 93 92  Resp: 18 18 20 18   Temp: 98 F (36.7 C) 98 F (36.7 C) 97.9 F (36.6 C) 98.1 F (36.7 C)  TempSrc: Oral Oral  Oral  SpO2: 99% 99% 99% 100%  Weight:        Physical Exam Constitutional: Patient appears comfortable, lying in bed in no acute distress CV: regular rate and rhythm, no m/r/g, no lower extremity edema Lungs: normal work of breathing on room air, lungs clear to auscultation bilaterally Abdominal: distended, mildly tender, bowel sounds continue to be hypoactive  Pertinent Labs: CBC Latest Ref Rng & Units 01/07/2020 01/06/2020 10/20/2019  WBC 4.0 - 10.5 K/uL 6.2 6.4 6.5  Hemoglobin 13.0 - 17.0 g/dL 9.5(L) 10.8(L) 15.6  Hematocrit 39 - 52 % 28.7(L) 33.3(L) 48.0  Platelets 150 - 400 K/uL 427(H) 441(H) 444(H)    CMP Latest Ref Rng & Units 01/10/2020 01/08/2020 01/08/2020  Glucose 70 - 99 mg/dL 89 98 80  BUN 6 - 20 mg/dL 13 13 12   Creatinine 0.61 - 1.24 mg/dL 0.66 0.76 0.83  Sodium 135 - 145 mmol/L 134(L) 132(L) 133(L)  Potassium 3.5 - 5.1 mmol/L 4.0 4.1 3.5  Chloride 98 - 111 mmol/L 104 102 102  CO2 22 - 32 mmol/L 25 21(L) 22  Calcium 8.9 - 10.3 mg/dL 7.0(L) 7.0(L) 7.0(L)  Total Protein 6.5 - 8.1 g/dL - - -  Total Bilirubin 0.3 - 1.2 mg/dL - - -  Alkaline Phos 38 - 126 U/L - - -  AST 15 - 41 U/L - - -  ALT 0 - 44 U/L - - -   Mg: 1.8  Assessment/Plan:   Principal Problem:   SBO (small bowel obstruction) (HCC) Active Problems:   Aggressive  behavior   Bipolar affective disorder, depressed, mild ([place])   Patient Summary: Tyler Dominguez is a 39 y.o. male with a pertinent PMH of SBO and bipolar disorder with pyschosis who presented with one week of abdominal cramping, distension and nausea and was admitted for SBO. Per chart review, he has had numerous past visits for SBO but left AMA prior to significant eval/treatment.   This is hospital day 5.  Small bowel obstruction Exploratory laparotomy with ileum and cecal resection POD 0 Re-evaluated by psychiatry yesterday who note that patient has capacity to make his own medical decisions. During rounds this morning, patient continues to remain distended on exam with hypoactive bowel sounds. Continues to ask for p.o. intake. Later this morning, patient consented for exploratory laparotomy by general surgery and taken for exploratory laparotomy with ileum and cecal resection. Surgical findings significant for large volume ascites, white masses on the mesentery and small bowel wall, bulkly LAD and mass in the mesentery of the terminal ileum. Patient reportedly tolerated the procedure well with minimal blood loss. Surgical specimens sent to the lab. Chromogranin level sent earlier this admission resulted and was elevated at 145.3. This is non-specific for carcinoid tumor.  - Surgery on board, appreciate recommendations  -  TPN per pharmacy consult - NPO except for ice chips - D5 1/2 NS with KCl 20 mEq @ 100 cc/hr - f/u surgical pathology  Mood disorder Bipolar disorder with psychosis Aggressive behavior Per psych re-eval on 10/26, patient had capacity to make an informed medical decision regarding his surgery. They note capacity may change from day-to-day. Patient's mother currently working on obtaining guardianship.  - Consider IV Haldol or IV benzodiazepine if patient becomes agitated, per psych  FEN/GI - D5 1/2 NS with KCl 20 mEq @ 100 cc/hr - Replenish to Mg >2 and K>4  - NPO except  ice chips - TPN per pharmacy - Patient at risk for refeeding. Monitor Phos.   VTE: SCDs Code: Full  Please contact the on call pager after 5 pm and on weekends at [phone].  Alexandria Lodge, MD PGY-1 Internal Medicine Teaching Service Pager: [phone] 01/11/2020
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
## 2020-01-11 NOTE — Transfer of Care (Signed)
Immediate Anesthesia Transfer of Care Note  Patient: Tyler Dominguez  Procedure(s) Performed: EXPLORATORY LAPAROTOMY (N/A Abdomen) SMALL BOWEL RESECTION (N/A Abdomen)  Patient Location: PACU  Anesthesia Type:General  Level of Consciousness: awake and patient cooperative  Airway & Oxygen Therapy: Patient Spontanous Breathing  Post-op Assessment: Report given to RN and Post -op Vital signs reviewed and stable  Post vital signs: Reviewed and stable  Last Vitals:  Vitals Value Taken Time  BP 105/72 01/11/20 1247  Temp    Pulse    Resp 18 01/11/20 1254  SpO2    Vitals shown include unvalidated device data.  Last Pain:  Vitals:   01/11/20 0953  TempSrc:   PainSc: 0-No pain      Patients Stated Pain Goal: 2 (39/43/20 0379)  Complications: No complications documented.

## 2020-01-11 NOTE — Anesthesia Procedure Notes (Signed)
Procedure Name: Intubation Date/Time: 01/11/2020 10:37 AM Performed by: Shirlyn Goltz, CRNA Pre-anesthesia Checklist: Patient identified, Emergency Drugs available, Suction available and Patient being monitored Patient Re-evaluated:Patient Re-evaluated prior to induction Oxygen Delivery Method: Circle system utilized Preoxygenation: Pre-oxygenation with 100% oxygen Induction Type: IV induction and Rapid sequence Laryngoscope Size: Glidescope and 4 Grade View: Grade I Tube type: Oral Tube size: 7.0 mm Number of attempts: 1 Airway Equipment and Method: Stylet and Video-laryngoscopy Placement Confirmation: ETT inserted through vocal cords under direct vision,  positive ETCO2 and breath sounds checked- equal and bilateral Secured at: 22 cm Tube secured with: Tape Dental Injury: Teeth and Oropharynx as per pre-operative assessment  Difficulty Due To: Difficulty was anticipated and Difficult Airway- due to reduced neck mobility

## 2020-01-11 NOTE — Anesthesia Postprocedure Evaluation (Signed)
Anesthesia Post Note  Patient: Tyler Dominguez  Procedure(s) Performed: EXPLORATORY LAPAROTOMY (N/A Abdomen) SMALL BOWEL RESECTION (N/A Abdomen)     Patient location during evaluation: PACU Anesthesia Type: General Level of consciousness: awake and alert Pain management: pain level controlled Vital Signs Assessment: post-procedure vital signs reviewed and stable Respiratory status: spontaneous breathing, nonlabored ventilation, respiratory function stable and patient connected to nasal cannula oxygen Cardiovascular status: blood pressure returned to baseline and stable Postop Assessment: no apparent nausea or vomiting Anesthetic complications: no   No complications documented.  Last Vitals:  Vitals:   01/11/20 1401 01/11/20 1415  BP: 107/77   Pulse:    Resp: 20   Temp:  (!) 36.2 C  SpO2: 100%     Last Pain:  Vitals:   01/11/20 1415  TempSrc:   PainSc: 9                  Rosendo Couser DAVID

## 2020-01-11 NOTE — Op Note (Addendum)
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   Pre-operative diagnosis:  Small bowel obstruction with mesenteric mass  Post-operative diagnosis:  Same  Procedure:  Exploratory laparotomy with ileum and cecal resection  Surgeon:  Maia Petties Assistant:  Dr. Stark Klein Resident:  Dr. Patton Salles [facility]  Procedure Details  The patient was seen in the Holding Room. The risks, benefits, complications, treatment options, and expected outcomes were discussed with the patient. The possibilities of reaction to medication, pulmonary aspiration, perforation of viscus, bleeding, recurrent infection, the need for additional procedures, and development of a complication requiring transfusion or further operation were discussed with the patient and/or family. There was concurrence with the proposed plan, and informed consent was obtained. The site of surgery was properly noted/marked. The patient was taken to the Operating Room, identified as TRU RANA, and the procedure verified as an exploratory laparotomy. A Time Out was held and the above information confirmed.  After an adequate level of general anesthesia was obtained, the patient's abdomen was prepped with Chloraprep and draped in sterile fashion.  We made a midline incision from upper abdomen to pubis with a 10 blade. We then carried the incision down to the fascia with electrocautery and entered the abdomen without injury. The incision was extended for the entire length of the incision and extremely dilated small bowel protruded from the incision. There was large volume ascites encountered throughout the case. The small bowel was run from ligament of treitz to ileum and there were portions in the terminal ileum with white masses on the mesentery and small bowel wall. There was bulky lymphadenopathy and mass in the mesentery of the terminal ileum. The terminal  ileum was doubled on itself and adhesions were present that also involved the cecum.    The decision was made to proceed with a cecectomy as well as ileal resection. The white line of Tolt was incised and the ascending colon was reflected medially. The mass was adherent to the right lower quadrant and was carefully removed along with the small bowel and cecum from the retroperitoneum with sharp dissection until it was free.   The cecum was then divided with a 75 blue GIA stapler. Ligasure and 2-0 silk sutures were used to divide the mesentery taking care to include all mass and lymphadenopathy. The ileum was then divided with a 75 blue GIA stapler. Approximately 45 cm of small bowel was taken. The specimen was passed off the field. The small bowel was decompressed via pushing the air and liquid retrograde to allow the NGT to suction out a good portion of bowel contents.   We then made a side to side anastomosis of the ascending colon to the remaining ileum with a 75 blue GIA stapler followed by a 60 TX stapler. 2-0 vicryl stitches were placed on either side of the anastomosis. The mesentery was not closed. The abdomen was irrigated with normal saline. The fascia was closed with double stranded 0 PDS. The subcutaneous tissue was irrigated then the skin was closed with staples. Clean dressing were applied.  The patient was extubated and brought to the recovery room in stable condition.  All sponge, instrument, and needle counts were correct prior to closure and at the conclusion of the case. Dr. Georgette Dover was present for all portions of the case.    Estimated Blood Loss: 50 mL  Complications: None; patient tolerated the procedure well.         Disposition: PACU - hemodynamically stable.         Condition: stable  Ahmed Prima, MD [facility] PGY 5  General Surgery   I was present for the critical and key portions of the surgery and I was immediately available throughout the enitre procedure.   I have reviewed and agree with the operative note as documented by the resident.  Imogene Burn. Georgette Dover, MD, [facility]  General/ Trauma Surgery   01/11/2020 12:55 PM

## 2020-01-11 NOTE — Progress Notes (Signed)
PHARMACY - TOTAL PARENTERAL NUTRITION CONSULT NOTE   Indication: Prolonged ileus  Patient Measurements: Weight: 61.2 kg (135 lb)   Body mass index is 19.37 kg/m. Usual Weight:   Assessment: 56 yom presented to the hospital with abdominal pain, nausea and vomiting. He has a history of SBO with ~20lb unintentional weight loss over the past month. Pt also with longstanding diarrhea. S/p >2 hour surgery 10/26 for mesenteric mass with anticipated ileus. Pt has been without PO intake for more than 6 days.   Glucose / Insulin: AM gluc ok Electrolytes: WNL except Na 134 Renal: SCr 0.66 LFTs / TGs: WNL Prealbumin / albumin: albumin <1 Intake / Output; MIVF: D51/2NS with 55meq/L at 18ml/hr GI Imaging: 10/21 CT abd - severe small bowel dilatation consistent with obstruction, enlarged mesenteric lymph nodes Surgeries / Procedures:  10/26 Ex lap with ileum and cecal resection  Central access: PICC line ordered TPN start date: 10/27  Nutritional Goals (per RD recommendation on pending):  Current Nutrition:  NPO  Plan:  Will start TPN tomorrow 10/27 as order received after noon cutoff time and central line access is still pending Initiate Sensitive q4h SSI and adjust as needed  F/u RD recommendations Monitor TPN labs on Mon/Thurs and tomorrow  Trenesha Alcaide, Rande Lawman 01/11/2020,1:47 PM

## 2020-01-12 ENCOUNTER — Encounter (HOSPITAL_COMMUNITY): Payer: Self-pay | Admitting: Surgery

## 2020-01-12 DIAGNOSIS — F39 Unspecified mood [affective] disorder: Secondary | ICD-10-CM

## 2020-01-12 DIAGNOSIS — E8809 Other disorders of plasma-protein metabolism, not elsewhere classified: Secondary | ICD-10-CM

## 2020-01-12 DIAGNOSIS — E43 Unspecified severe protein-calorie malnutrition: Secondary | ICD-10-CM | POA: Insufficient documentation

## 2020-01-12 LAB — GLUCOSE, CAPILLARY
Glucose-Capillary: 107 mg/dL — ABNORMAL HIGH (ref 70–99)
Glucose-Capillary: 109 mg/dL — ABNORMAL HIGH (ref 70–99)
Glucose-Capillary: 113 mg/dL — ABNORMAL HIGH (ref 70–99)
Glucose-Capillary: 115 mg/dL — ABNORMAL HIGH (ref 70–99)
Glucose-Capillary: 115 mg/dL — ABNORMAL HIGH (ref 70–99)
Glucose-Capillary: 119 mg/dL — ABNORMAL HIGH (ref 70–99)
Glucose-Capillary: 119 mg/dL — ABNORMAL HIGH (ref 70–99)
Glucose-Capillary: 123 mg/dL — ABNORMAL HIGH (ref 70–99)

## 2020-01-12 LAB — CBC
HCT: 22.1 % — ABNORMAL LOW (ref 39.0–52.0)
Hemoglobin: 7.4 g/dL — ABNORMAL LOW (ref 13.0–17.0)
MCH: 31.2 pg (ref 26.0–34.0)
MCHC: 33.5 g/dL (ref 30.0–36.0)
MCV: 93.2 fL (ref 80.0–100.0)
Platelets: 406 10*3/uL — ABNORMAL HIGH (ref 150–400)
RBC: 2.37 MIL/uL — ABNORMAL LOW (ref 4.22–5.81)
RDW: 19.8 % — ABNORMAL HIGH (ref 11.5–15.5)
WBC: 8.4 10*3/uL (ref 4.0–10.5)
nRBC: 0.2 % (ref 0.0–0.2)

## 2020-01-12 LAB — DIFFERENTIAL
Abs Immature Granulocytes: 0.05 10*3/uL (ref 0.00–0.07)
Basophils Absolute: 0 10*3/uL (ref 0.0–0.1)
Basophils Relative: 0 %
Eosinophils Absolute: 0 10*3/uL (ref 0.0–0.5)
Eosinophils Relative: 0 %
Immature Granulocytes: 1 %
Lymphocytes Relative: 21 %
Lymphs Abs: 1.8 10*3/uL (ref 0.7–4.0)
Monocytes Absolute: 0.4 10*3/uL (ref 0.1–1.0)
Monocytes Relative: 5 %
Neutro Abs: 6.1 10*3/uL (ref 1.7–7.7)
Neutrophils Relative %: 73 %

## 2020-01-12 LAB — COMPREHENSIVE METABOLIC PANEL
ALT: 14 U/L (ref 0–44)
AST: 18 U/L (ref 15–41)
Albumin: 1.5 g/dL — ABNORMAL LOW (ref 3.5–5.0)
Alkaline Phosphatase: 61 U/L (ref 38–126)
Anion gap: 6 (ref 5–15)
BUN: 9 mg/dL (ref 6–20)
CO2: 23 mmol/L (ref 22–32)
Calcium: 7.4 mg/dL — ABNORMAL LOW (ref 8.9–10.3)
Chloride: 107 mmol/L (ref 98–111)
Creatinine, Ser: 0.79 mg/dL (ref 0.61–1.24)
GFR, Estimated: >60 mL/min (ref >60)
Glucose, Bld: 109 mg/dL — ABNORMAL HIGH (ref 70–99)
Potassium: 4.7 mmol/L (ref 3.5–5.1)
Sodium: 136 mmol/L (ref 135–145)
Total Bilirubin: 0.5 mg/dL (ref 0.3–1.2)
Total Protein: 3.7 g/dL — ABNORMAL LOW (ref 6.5–8.1)

## 2020-01-12 LAB — HEMOGLOBIN A1C
Hgb A1c MFr Bld: 4.4 % — ABNORMAL LOW (ref 4.8–5.6)
Mean Plasma Glucose: 80 mg/dL

## 2020-01-12 LAB — MAGNESIUM: Magnesium: 1.6 mg/dL — ABNORMAL LOW (ref 1.7–2.4)

## 2020-01-12 LAB — PHOSPHORUS: Phosphorus: 3 mg/dL (ref 2.5–4.6)

## 2020-01-12 LAB — PREALBUMIN: Prealbumin: <5 mg/dL — ABNORMAL LOW (ref 18–38)

## 2020-01-12 LAB — ABO/RH: ABO/RH(D): AB POS

## 2020-01-12 LAB — PREPARE RBC (CROSSMATCH)

## 2020-01-12 LAB — TRIGLYCERIDES: Triglycerides: 27 mg/dL (ref <150)

## 2020-01-12 MED ORDER — SODIUM CHLORIDE 0.9% FLUSH: 10.0000 mL | INTRAVENOUS | Status: DC | PRN

## 2020-01-12 MED ORDER — SODIUM CHLORIDE 0.9% IV SOLUTION: Freq: Once | INTRAVENOUS | Status: AC

## 2020-01-12 MED ORDER — MAGNESIUM SULFATE IN D5W 1-5 GM/100ML-% IV SOLN
1.0000 g | Freq: Once | INTRAVENOUS | Status: AC
  Administered 2020-01-12: 1 g via INTRAVENOUS
  Filled 2020-01-12: qty 100

## 2020-01-12 MED ORDER — MORPHINE SULFATE (PF) 2 MG/ML IV SOLN
2.0000 mg | INTRAVENOUS | Status: DC | PRN
  Filled 2020-01-12: qty 2

## 2020-01-12 MED ORDER — SODIUM CHLORIDE 0.9% FLUSH
10.0000 mL | Freq: Two times a day (BID) | INTRAVENOUS | Status: DC
  Administered 2020-01-12 – 2020-01-15 (×7): 10 mL

## 2020-01-12 MED ORDER — TRAVASOL 10 % IV SOLN
INTRAVENOUS | Status: AC
  Filled 2020-01-12: qty 302.4

## 2020-01-12 MED ORDER — LACTATED RINGERS IV BOLUS
500.0000 mL | Freq: Once | INTRAVENOUS | Status: AC
  Administered 2020-01-12: 500 mL via INTRAVENOUS

## 2020-01-12 MED ORDER — KCL IN DEXTROSE-NACL 20-5-0.45 MEQ/L-%-% IV SOLN
INTRAVENOUS | Status: DC
  Administered 2020-01-12: 1 mL via INTRAVENOUS
  Filled 2020-01-12 (×2): qty 1000

## 2020-01-12 NOTE — Progress Notes (Signed)
PHARMACY - TOTAL PARENTERAL NUTRITION CONSULT NOTE   Indication: Prolonged ileus  Patient Measurements: Weight: 61.2 kg (135 lb)   Body mass index is 19.37 kg/m. Usual Weight:   Assessment: 21 yom presented to the hospital with abdominal pain, nausea and vomiting. He has a history of SBO with ~20lb unintentional weight loss over the past month. Pt also with longstanding diarrhea. S/p >2 hour surgery 10/26 for mesenteric mass with anticipated ileus. Pt has been without PO intake for more than 6 days. He is at risk for refeeding.   Glucose / Insulin: CBGs at goal, has not yet required insulin Electrolytes: WNL except Mg 1.6 (supplemented) Renal: SCr 0.79, BUN 9 LFTs / TGs: WNL, TG 27 Prealbumin / albumin: albumin 1.5, prealbumin <5 Intake / Output; MIVF: D51/2NS with 18meq/L at 125ml/hr, UOP 0.4, LBM 10/23, NG output 100 GI Imaging: 10/21 CT abd - severe small bowel dilatation consistent with obstruction, enlarged mesenteric lymph nodes Surgeries / Procedures:  10/26 Ex lap with ileum and cecal resection  Central access: PICC placed 10/27 TPN start date: 10/27  Nutritional Goals (per RD recommendation on pending): kCal: 1800-2100, Protein: 75-90g/day, Fluid: ~2L Goal TPN rate is 80 mL/hr (provides 80 g of protein and 1812 kcals per day)  Current Nutrition:  NPO  Plan:  Start TPN conservatively at 52mL/hr at 1800 due to risk for refeeding Electrolytes in TPN: 9mEq/L of Na, 80mEq/L of K, 66mEq/L of Ca, 39mEq/L of Mg, and 1mmol/L of Phos. Cl:Ac ratio 1:1 Add standard MVI and trace elements to TPN Continue Sensitive q4h SSI and adjust as needed  Reduce MIVF to 70 mL/hr at 1800 Monitor TPN labs on Mon/Thurs F/u RD recommendations  Tyler Dominguez, Tyler Dominguez 01/12/2020,10:25 AM
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
## 2020-01-12 NOTE — Plan of Care (Signed)
  Problem: Education: Goal: Knowledge of General Education information will improve Description: Including pain rating scale, medication(s)/side effects and non-pharmacologic comfort measures Outcome: Progressing   Problem: Clinical Measurements: Goal: Will remain free from infection Outcome: Progressing   Problem: Nutrition: Goal: Adequate nutrition will be maintained Outcome: Progressing   
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
## 2020-01-12 NOTE — TOC Initial Note (Signed)
Transition of Care [facility]) - Initial/Assessment Note    Patient Details  Name: Tyler Dominguez MRN: [id number] Date of Birth: 16-Sep-1980  Transition of Care [facility]) CM/SW Contact:    Joanne Chars, LCSW Phone Number: 01/12/2020, 12:12 PM  Clinical Narrative:     CSW met with pt and pt mother, Leonarda Salon.  Permission given by pt to speak with mother.  Pt answered general questions about how he was doing.  Discussed status of guardianship application process.  Pt was deemed competent to consent for surgery yesterday, however, mother reports she is still planning to pursue guardianship through the court.  She was given the paperwork at the courthouse but there is $150 filing feel that she has to pay and she does not yet have that.  Pt is not vaccinated.  No discharge recommendations yet, CSW will continue to follow for discharge needs.               Expected Discharge Plan:  (undetermined) Barriers to Discharge: Continued Medical Work up   Patient Goals and CMS Choice Patient states their goals for this hospitalization and ongoing recovery are:: "get out of here"      Expected Discharge Plan and Services Expected Discharge Plan:  (undetermined)     Post Acute Care Choice:  (waiting on recommendations) Living arrangements for the past 2 months: Single Family Home                                      Prior Living Arrangements/Services Living arrangements for the past 2 months: Single Family Home Lives with:: Parents Patient language and need for interpreter reviewed:: Yes Do you feel safe going back to the place where you live?: Yes      Need for Family Participation in Patient Care: Yes (Comment) Care giver support system in place?: Yes (comment) Current home services: Other (comment) (none) Criminal Activity/Legal Involvement Pertinent to Current Situation/Hospitalization: No - Comment as needed  Activities of Daily Living      Permission  Sought/Granted Permission sought to share information with : Family Supports Permission granted to share information with : Yes, Verbal Permission Granted  Share Information with NAME: mother, Leonarda Salon           Emotional Assessment Appearance:: Appears stated age Attitude/Demeanor/Rapport: Engaged Affect (typically observed): Withdrawn Orientation: : Oriented to Self, Oriented to Place, Oriented to  Time, Oriented to Situation Alcohol / Substance Use: Illicit Drugs Psych Involvement: No (comment)  Admission diagnosis:  Dehydration [E86.0] Hypokalemia [E87.6] Hypoalbuminemia [E88.09] Small bowel obstruction (HCC) [K56.609] SBO (small bowel obstruction) (HCC) [K56.609] Severe protein-calorie malnutrition ([place]) [E43] Encounter for imaging study to confirm nasogastric (NG) tube placement [Z01.89] Elevated lactic acid level [R79.89] Hypotension due to hypovolemia [I95.89, E86.1] Patient Active Problem List   Diagnosis Date Noted  . Bipolar affective disorder, depressed, mild ([place]) 01/10/2020  . SBO (small bowel obstruction) ([place]) 05/28/2018  . Small bowel obstruction ([place]) 05/28/2018  . Cannabis use disorder, severe, dependence ([place]) 07/12/2016  . Intermittent explosive disorder 10/11/2015  . Aggressive behavior 06/09/2013  . Substance abuse ([place]) 06/09/2013  . Cannabis abuse 11/10/2012  . Psychosis ([place]) 11/07/2012   PCP:  Patient, No Pcp Per Pharmacy:   [place] ([place]), [place] - [address] [place] [address] Phone: [phone] Fax: [place], [place] -  [place] [address] Phone: [phone] Fax: [phone]     Social Determinants of Health (SDOH) Interventions    Readmission Risk Interventions No flowsheet data found.

## 2020-01-12 NOTE — Progress Notes (Signed)
HD#6 Subjective:   Overnight, MAPs in the 70s, patient asymptomatic. Given a 500 cc bolus of LR with good effect.  Patient evaluated at bedside during rounds. He states he is doing "alright." Denies abdominal pain, nausea, vomiting. Asks to have his NG tube removed and when he can eat. Reports he has passed flatus twice but has not had a bowel movement.   Objective:   Vital signs in last 24 hours: Vitals:   01/12/20 0059 01/12/20 0343 01/12/20 0344 01/12/20 0608  BP: (!) 85/67 (!) 87/60  (!) 91/56  Pulse:  (!) 105  82  Resp:  18 18 20   Temp:  98.6 F (37 C)  98.1 F (36.7 C)  TempSrc:  Oral  Axillary  SpO2:  98% 98% 94%  Weight:       Physical Exam Constitutional: Patient appears comfortable, lying in bed in no acute distress HENT: NG tube in place  CV: regular rate and rhythm, no m/r/g, no lower extremity edema Lungs: normal work of breathing on room air, lungs clear to auscultation bilaterally Abdominal: mildly distended, bowel sounds hypoactive, midline surgical dressing clean/dry/intact   Pertinent Labs: CBC Latest Ref Rng & Units 01/12/2020 01/11/2020 01/07/2020  WBC 4.0 - 10.5 K/uL 8.4 5.4 6.2  Hemoglobin 13.0 - 17.0 g/dL 7.4(L) 9.1(L) 9.5(L)  Hematocrit 39 - 52 % 22.1(L) 28.9(L) 28.7(L)  Platelets 150 - 400 K/uL 406(H) 467(H) 427(H)    CMP Latest Ref Rng & Units 01/12/2020 01/11/2020 01/10/2020  Glucose 70 - 99 mg/dL 109(H) 135(H) 89  BUN 6 - 20 mg/dL 9 10 13   Creatinine 0.61 - 1.24 mg/dL 0.79 0.78 0.66  Sodium 135 - 145 mmol/L 136 137 134(L)  Potassium 3.5 - 5.1 mmol/L 4.7 4.5 4.0  Chloride 98 - 111 mmol/L 107 107 104  CO2 22 - 32 mmol/L 23 20(L) 25  Calcium 8.9 - 10.3 mg/dL 7.4(L) 7.4(L) 7.0(L)  Total Protein 6.5 - 8.1 g/dL 3.7(L) 4.3(L) -  Total Bilirubin 0.3 - 1.2 mg/dL 0.5 0.8 -  Alkaline Phos 38 - 126 U/L 61 68 -  AST 15 - 41 U/L 18 19 -  ALT 0 - 44 U/L 14 16 -   Mg: 1.6 --> repleted  Assessment/Plan:   Principal Problem:   SBO (small bowel  obstruction) (HCC) Active Problems:   Aggressive behavior   Bipolar affective disorder, depressed, mild ([place])   Patient Summary: Tyler Dominguez is a 39 y.o. male with a pertinent PMH of SBO and bipolar disorder with pyschosis who presented with one week of abdominal cramping, distension and nausea and was admitted for SBO. Per chart review, he has had numerous past visits for SBO but left AMA prior to significant eval/treatment.   This is hospital day 6.  Small bowel obstruction Exploratory laparotomy with ileum and cecal resection POD 1 Patient is well-appearing post-operatively, states his pain is well controlled, abdominal distension improved, has passed flatus but no BM. Blood pressures relatively low overnight, and with hemoglobin 7.4 (decreased from 9.1 preoperatively), patient bolused with 500 cc LR and transfused 2 u pRBCs. PICC and TPN planned for today.  - 2u pRBCs - Surgery on board, appreciate recommendations  - TPN per pharmacy consult  - NPO except for ice chips  - d/c PCA  - IV acetaminophen - D5 1/2 NS with KCl 20 mEq @ 100 cc/hr - d/c foley - Encourage OOB - Incentive spirometer - f/u surgical pathology  Mood disorder Bipolar disorder with psychosis Aggressive behavior  Per psych re-eval on 10/26, patient had capacity to make an informed medical decision regarding his surgery. They note capacity may change from day-to-day. Patient's mother currently working on obtaining guardianship.  - Patient on monthly aripiprazole (Abilify), last dose 12/28/19 - Consider IV Haldol or IV benzodiazepine if patient becomes agitated, per psych  FEN/GI - D5 1/2 NS with KCl 20 mEq @ 100 cc/hr - Replenish to Mg >2 and K>4  - NPO except ice chips - TPN per pharmacy - Patient at risk for refeeding. Monitor Phos.   VTE: SCDs Code: Full  Please contact the on call pager after 5 pm and on weekends at [phone].  Alexandria Lodge, MD PGY-1 Internal Medicine Teaching  Service Pager: [phone] 01/12/2020

## 2020-01-12 NOTE — Progress Notes (Signed)
PT Cancellation Note  Patient Details Name: Tyler Dominguez MRN: [id number] DOB: 04-May-1980   Cancelled Treatment:    Reason Eval/Treat Not Completed: Patient at procedure or test/unavailable.  Pt is beginning a transfusion, will re-attempt as time and pt allow.   Ramond Dial 01/12/2020, 2:44 PM   Mee Hives, PT MS Acute Rehab Dept. Number: [place] and [place]
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
## 2020-01-12 NOTE — Progress Notes (Signed)
Peripherally Inserted Central Catheter Placement  The IV Nurse has discussed with the patient and/or persons authorized to consent for the patient, the purpose of this procedure and the potential benefits and risks involved with this procedure.  The benefits include less needle sticks, lab draws from the catheter, and the patient may be discharged home with the catheter. Risks include, but not limited to, infection, bleeding, blood clot (thrombus formation), and puncture of an artery; nerve damage and irregular heartbeat and possibility to perform a PICC exchange if needed/ordered by physician.  Alternatives to this procedure were also discussed.  Bard Power PICC patient education guide, fact sheet on infection prevention and patient information card has been provided to patient /or left at bedside.    PICC Placement Documentation  PICC Double Lumen 91/69/45 PICC Right Basilic 41 cm 1 cm (Active)  Indication for Insertion or Continuance of Line Administration of hyperosmolar/irritating solutions (i.e. TPN, Vancomycin, etc.) 01/12/20 0900  Exposed Catheter (cm) 1 cm 01/12/20 0900  Site Assessment Intact;Dry;Clean 01/12/20 0900  Lumen #1 Status Flushed;Saline locked;Blood return noted 01/12/20 0900  Lumen #2 Status Flushed;Saline locked;Blood return noted 01/12/20 0900  Dressing Type Transparent;Securing device 01/12/20 0900  Dressing Status Clean;Dry;Intact 01/12/20 0900  Antimicrobial disc in place? Yes 01/12/20 0900  Line Care Connections checked and tightened 01/12/20 0900  Dressing Intervention New dressing 01/12/20 0900  Dressing Change Due 01/19/20 01/12/20 0900       Holley Bouche Stark 01/12/2020, 9:24 AM

## 2020-01-13 ENCOUNTER — Inpatient Hospital Stay (HOSPITAL_COMMUNITY): Payer: Medicaid Other

## 2020-01-13 LAB — GLUCOSE, CAPILLARY
Glucose-Capillary: 112 mg/dL — ABNORMAL HIGH (ref 70–99)
Glucose-Capillary: 119 mg/dL — ABNORMAL HIGH (ref 70–99)
Glucose-Capillary: 122 mg/dL — ABNORMAL HIGH (ref 70–99)
Glucose-Capillary: 126 mg/dL — ABNORMAL HIGH (ref 70–99)
Glucose-Capillary: 81 mg/dL (ref 70–99)
Glucose-Capillary: 87 mg/dL (ref 70–99)
Glucose-Capillary: 92 mg/dL (ref 70–99)

## 2020-01-13 LAB — CBC WITH DIFFERENTIAL/PLATELET
Abs Immature Granulocytes: 0.08 10*3/uL — ABNORMAL HIGH (ref 0.00–0.07)
Abs Immature Granulocytes: 0.16 10*3/uL — ABNORMAL HIGH (ref 0.00–0.07)
Basophils Absolute: 0 10*3/uL (ref 0.0–0.1)
Basophils Absolute: 0 10*3/uL (ref 0.0–0.1)
Basophils Relative: 0 %
Basophils Relative: 0 %
Eosinophils Absolute: 0 10*3/uL (ref 0.0–0.5)
Eosinophils Absolute: 0 10*3/uL (ref 0.0–0.5)
Eosinophils Relative: 0 %
Eosinophils Relative: 0 %
HCT: 31.2 % — ABNORMAL LOW (ref 39.0–52.0)
HCT: 31.7 % — ABNORMAL LOW (ref 39.0–52.0)
Hemoglobin: 10.6 g/dL — ABNORMAL LOW (ref 13.0–17.0)
Hemoglobin: 10.7 g/dL — ABNORMAL LOW (ref 13.0–17.0)
Immature Granulocytes: 1 %
Immature Granulocytes: 1 %
Lymphocytes Relative: 20 %
Lymphocytes Relative: 13 %
Lymphs Abs: 2.3 10*3/uL (ref 0.7–4.0)
Lymphs Abs: 2.1 10*3/uL (ref 0.7–4.0)
MCH: 29.9 pg (ref 26.0–34.0)
MCH: 30.1 pg (ref 26.0–34.0)
MCHC: 34 g/dL (ref 30.0–36.0)
MCHC: 33.8 g/dL (ref 30.0–36.0)
MCV: 88.1 fL (ref 80.0–100.0)
MCV: 89 fL (ref 80.0–100.0)
Monocytes Absolute: 0.7 10*3/uL (ref 0.1–1.0)
Monocytes Absolute: 0.7 10*3/uL (ref 0.1–1.0)
Monocytes Relative: 6 %
Monocytes Relative: 5 %
Neutro Abs: 8.5 10*3/uL — ABNORMAL HIGH (ref 1.7–7.7)
Neutro Abs: 13.2 10*3/uL — ABNORMAL HIGH (ref 1.7–7.7)
Neutrophils Relative %: 73 %
Neutrophils Relative %: 81 %
Platelets: 307 10*3/uL (ref 150–400)
Platelets: 311 10*3/uL (ref 150–400)
RBC: 3.54 MIL/uL — ABNORMAL LOW (ref 4.22–5.81)
RBC: 3.56 MIL/uL — ABNORMAL LOW (ref 4.22–5.81)
RDW: 18.8 % — ABNORMAL HIGH (ref 11.5–15.5)
RDW: 19 % — ABNORMAL HIGH (ref 11.5–15.5)
WBC: 11.6 10*3/uL — ABNORMAL HIGH (ref 4.0–10.5)
WBC: 16.2 10*3/uL — ABNORMAL HIGH (ref 4.0–10.5)
nRBC: 0 % (ref 0.0–0.2)
nRBC: 0 % (ref 0.0–0.2)

## 2020-01-13 LAB — COMPREHENSIVE METABOLIC PANEL
ALT: 19 U/L (ref 0–44)
AST: 16 U/L (ref 15–41)
Albumin: 1.4 g/dL — ABNORMAL LOW (ref 3.5–5.0)
Alkaline Phosphatase: 87 U/L (ref 38–126)
Anion gap: 6 (ref 5–15)
BUN: 7 mg/dL (ref 6–20)
CO2: 27 mmol/L (ref 22–32)
Calcium: 7.2 mg/dL — ABNORMAL LOW (ref 8.9–10.3)
Chloride: 102 mmol/L (ref 98–111)
Creatinine, Ser: 0.73 mg/dL (ref 0.61–1.24)
GFR, Estimated: >60 mL/min (ref >60)
Glucose, Bld: 91 mg/dL (ref 70–99)
Potassium: 4 mmol/L (ref 3.5–5.1)
Sodium: 135 mmol/L (ref 135–145)
Total Bilirubin: 1.3 mg/dL — ABNORMAL HIGH (ref 0.3–1.2)
Total Protein: 3.9 g/dL — ABNORMAL LOW (ref 6.5–8.1)

## 2020-01-13 LAB — PROCALCITONIN: Procalcitonin: 29.87 ng/mL

## 2020-01-13 LAB — MAGNESIUM
Magnesium: 1.7 mg/dL (ref 1.7–2.4)
Magnesium: 1.9 mg/dL (ref 1.7–2.4)

## 2020-01-13 LAB — BASIC METABOLIC PANEL
Anion gap: 3 — ABNORMAL LOW (ref 5–15)
BUN: 7 mg/dL (ref 6–20)
CO2: 27 mmol/L (ref 22–32)
Calcium: 7.2 mg/dL — ABNORMAL LOW (ref 8.9–10.3)
Chloride: 105 mmol/L (ref 98–111)
Creatinine, Ser: 0.69 mg/dL (ref 0.61–1.24)
GFR, Estimated: >60 mL/min (ref >60)
Glucose, Bld: 125 mg/dL — ABNORMAL HIGH (ref 70–99)
Potassium: 4 mmol/L (ref 3.5–5.1)
Sodium: 135 mmol/L (ref 135–145)

## 2020-01-13 LAB — TYPE AND SCREEN
ABO/RH(D): AB POS
Antibody Screen: NEGATIVE
Unit division: 0
Unit division: 0

## 2020-01-13 LAB — BPAM RBC
Blood Product Expiration Date: 202111182359
Blood Product Expiration Date: 202111182359
ISSUE DATE / TIME: 202110271244
ISSUE DATE / TIME: 202110271737
Unit Type and Rh: 6200
Unit Type and Rh: 6200

## 2020-01-13 LAB — PHOSPHORUS
Phosphorus: 2.5 mg/dL (ref 2.5–4.6)
Phosphorus: 2.1 mg/dL — ABNORMAL LOW (ref 2.5–4.6)

## 2020-01-13 LAB — SURGICAL PATHOLOGY

## 2020-01-13 LAB — TROPONIN I (HIGH SENSITIVITY): Troponin I (High Sensitivity): 4 ng/L (ref <18)

## 2020-01-13 LAB — TSH: TSH: 2.409 u[IU]/mL (ref 0.350–4.500)

## 2020-01-13 MED ORDER — AMIODARONE HCL IN DEXTROSE 360-4.14 MG/200ML-% IV SOLN
60.0000 mg/h | INTRAVENOUS | Status: DC
  Filled 2020-01-13: qty 200

## 2020-01-13 MED ORDER — MAGNESIUM SULFATE 2 GM/50ML IV SOLN
2.0000 g | Freq: Once | INTRAVENOUS | Status: AC
  Administered 2020-01-13: 2 g via INTRAVENOUS
  Filled 2020-01-13 (×2): qty 50

## 2020-01-13 MED ORDER — AMIODARONE HCL IN DEXTROSE 360-4.14 MG/200ML-% IV SOLN
30.0000 mg/h | INTRAVENOUS | Status: DC
  Filled 2020-01-13: qty 200

## 2020-01-13 MED ORDER — METOPROLOL TARTRATE 25 MG/10 ML ORAL SUSPENSION: 12.5000 mg | Freq: Two times a day (BID) | ORAL | Status: DC

## 2020-01-13 MED ORDER — MAGNESIUM SULFATE IN D5W 1-5 GM/100ML-% IV SOLN
1.0000 g | Freq: Once | INTRAVENOUS | Status: AC
  Administered 2020-01-13: 1 g via INTRAVENOUS
  Filled 2020-01-13: qty 100

## 2020-01-13 MED ORDER — AMIODARONE LOAD VIA INFUSION
150.0000 mg | Freq: Once | INTRAVENOUS | Status: DC
  Filled 2020-01-13: qty 83.34

## 2020-01-13 MED ORDER — TRAVASOL 10 % IV SOLN
INTRAVENOUS | Status: AC
  Filled 2020-01-13: qty 388.8

## 2020-01-13 MED ORDER — LORAZEPAM 2 MG/ML IJ SOLN
0.5000 mg | Freq: Once | INTRAMUSCULAR | Status: AC
  Administered 2020-01-13: 0.5 mg via INTRAVENOUS
  Filled 2020-01-13: qty 1

## 2020-01-13 MED ORDER — MELATONIN 3 MG PO TABS: 3.0000 mg | ORAL_TABLET | Freq: Every evening | ORAL | Status: DC | PRN

## 2020-01-13 MED ORDER — LACTATED RINGERS IV BOLUS
1000.0000 mL | Freq: Once | INTRAVENOUS | Status: AC
  Administered 2020-01-13: 1000 mL via INTRAVENOUS

## 2020-01-13 MED ORDER — HYDROMORPHONE HCL 1 MG/ML IJ SOLN: 0.5000 mg | Freq: Once | INTRAMUSCULAR | Status: DC

## 2020-01-13 MED ORDER — ENOXAPARIN SODIUM 40 MG/0.4ML ~~LOC~~ SOLN
40.0000 mg | SUBCUTANEOUS | Status: DC
  Administered 2020-01-13 – 2020-01-15 (×3): 40 mg via SUBCUTANEOUS
  Filled 2020-01-13 (×3): qty 0.4

## 2020-01-13 MED ORDER — TRAVASOL 10 % IV SOLN: INTRAVENOUS | Status: DC

## 2020-01-13 MED ORDER — POTASSIUM CHLORIDE 2 MEQ/ML IV SOLN
INTRAVENOUS | Status: DC
  Filled 2020-01-13 (×2): qty 1000

## 2020-01-13 MED ORDER — HYDROMORPHONE HCL 1 MG/ML IJ SOLN: 1.0000 mg | Freq: Once | INTRAMUSCULAR | Status: DC

## 2020-01-13 MED ORDER — MAGNESIUM SULFATE 2 GM/50ML IV SOLN
2.0000 g | Freq: Once | INTRAVENOUS | Status: AC
  Administered 2020-01-13: 2 g via INTRAVENOUS
  Filled 2020-01-13: qty 50

## 2020-01-13 MED ORDER — POTASSIUM PHOSPHATES 15 MMOLE/5ML IV SOLN
25.0000 mmol | Freq: Once | INTRAVENOUS | Status: AC
  Administered 2020-01-13: 25 mmol via INTRAVENOUS
  Filled 2020-01-13: qty 8.33

## 2020-01-13 MED ORDER — TRAZODONE HCL 100 MG PO TABS: 200.0000 mg | ORAL_TABLET | Freq: Every evening | ORAL | Status: DC | PRN

## 2020-01-13 NOTE — Progress Notes (Addendum)
ANTICOAGULATION CONSULT NOTE  Pharmacy Consult for heparin Indication: VTE prophylaxis  Heparin Dosing Weight: 61.2kg  Labs: Recent Labs    01/11/20 1800 01/11/20 1800 01/12/20 0248 01/13/20 0054 01/13/20 0958  HGB 9.1*   < > 7.4* 10.6*  --   HCT 28.9*  --  22.1* 31.2*  --   PLT 467*  --  406* 307  --   CREATININE 0.78   < > 0.79 0.69 0.73   < > = values in this interval not displayed.    Assessment: 16 yom presenting with SBO s/p OR. Noted new afib with RVR post-op but now converted to NSR and resolved. Pharmacy consulted to dose lovenox for VTE prophylaxis. Patient is not on anticoagulation PTA. SCr wnl, Hg 10.6 stable, plt wnl. No active bleed issues reported.  Goal of Therapy:  VTE prevention Monitor platelets by anticoagulation protocol: Yes   Plan:  Enoxaparin 40mg  [place] q24h Monitor CBC, SCr, s/sx bleeding Pharmacy will sign off consult and monitor peripherally   Elicia Lamp, PharmD, BCPS Clinical Pharmacist 01/13/2020 3:38 PM

## 2020-01-13 NOTE — Plan of Care (Signed)
  Problem: Education: Goal: Knowledge of General Education information will improve Description Including pain rating scale, medication(s)/side effects and non-pharmacologic comfort measures Outcome: Progressing   

## 2020-01-13 NOTE — Progress Notes (Addendum)
Pt IV was beeping,therefore I went into there pt's room to check on it and the pt was found face down on the floor with NG tube pulled out. The pt is an independent walking man. Per pt he was trying to go to the bathroom to urinate, even though he has a urinal at bedside and had been using it for days prior. Pt said he was not in pain nor did his head hurt. Dr. Bridgett Larsson was notified and a CT of the head was ordered. In addition, safety zone portal incident was done as well as post fall assessment. Pt is now placed on bed alarm precautions and was educated on the importance of calling the nurse and tech for help. Will continue to monitor.

## 2020-01-13 NOTE — Progress Notes (Signed)
HD#7 Subjective:   Overnight, night team paged for new Afib with RVR with rate as high as the 160s. See Dr. Lianne Moris note from earlier this morning for details. He returned to normal sinus rhythm with rates 100s-110s spontaneously. CT chest abdomen pelvis was obtained and demonstrated moderate air and fluid in the abdomen as well as moderate bilateral pleural effusions. Patient placed on 3 L [place]. Later in the morning, the patient was found by his nurse face down on the floor with his NG tube out. The patient was reportedly trying to go to the bathroom to ambulate independently. Denied pain or hitting his head. CTH was ordered and negative.   Evaluated at bedside during rounds. Patient's mother at bedside. Patient on room air. Mother reports surgery had just been by and praised the patient's progress. Also shared he had a large, well-formed BM. Was told surgery was planning on advancing his diet to clear liquids. Patient states he is feeling "alright," denies pain. When asked about falling, he denies hitting his head. Denies shortness of breath.  Objective:   Vital signs in last 24 hours: Vitals:   01/13/20 0154 01/13/20 0225 01/13/20 0314 01/13/20 0511  BP: 92/66 (!) 92/54 96/71 97/60   Pulse: (!) 134 (!) 145 (!) 109 (!) 108  Resp: 18 20 18    Temp: 98 F (36.7 C) 98 F (36.7 C) 97.9 F (36.6 C) 98 F (36.7 C)  TempSrc: Oral Oral Oral Oral  SpO2: 93% 92% 93% 97%  Weight:       Physical Exam Constitutional: Patient appears comfortable, sitting in bedside chair, no acute distress HENT: No NG tube or nasal cannulae in place CV: tachycardic in the 100s, no m/r/g, no lower extremity edema Lungs: normal work of breathing on room air, lungs sounds diminished in bilateral bases Abdominal: mildly distended, bowel sounds hypoactive  Pertinent Labs: CBC Latest Ref Rng & Units 01/13/2020 01/12/2020 01/11/2020  WBC 4.0 - 10.5 K/uL 11.6(H) 8.4 5.4  Hemoglobin 13.0 - 17.0 g/dL 10.6(L) 7.4(L)  9.1(L)  Hematocrit 39 - 52 % 31.2(L) 22.1(L) 28.9(L)  Platelets 150 - 400 K/uL 307 406(H) 467(H)    CMP Latest Ref Rng & Units 01/13/2020 01/12/2020 01/11/2020  Glucose 70 - 99 mg/dL 125(H) 109(H) 135(H)  BUN 6 - 20 mg/dL 7 9 10   Creatinine 0.61 - 1.24 mg/dL 0.69 0.79 0.78  Sodium 135 - 145 mmol/L 135 136 137  Potassium 3.5 - 5.1 mmol/L 4.0 4.7 4.5  Chloride 98 - 111 mmol/L 105 107 107  CO2 22 - 32 mmol/L 27 23 20(L)  Calcium 8.9 - 10.3 mg/dL 7.2(L) 7.4(L) 7.4(L)  Total Protein 6.5 - 8.1 g/dL - 3.7(L) 4.3(L)  Total Bilirubin 0.3 - 1.2 mg/dL - 0.5 0.8  Alkaline Phos 38 - 126 U/L - 61 68  AST 15 - 41 U/L - 18 19  ALT 0 - 44 U/L - 14 16   Mg: 1.9 --> repleted Phos: 2.1 --> repleted  Assessment/Plan:   Principal Problem:   SBO (small bowel obstruction) (HCC) Active Problems:   Aggressive behavior   Bipolar affective disorder, depressed, mild (HCC)   Hypoalbuminemia   Protein-calorie malnutrition, severe   Patient Summary: Tyler Dominguez is a 39 y.o. male with a pertinent PMH of SBO and bipolar disorder with pyschosis who presented with one week of abdominal cramping, distension and nausea and was admitted for SBO. Per chart review, he has had numerous past visits for SBO but left AMA prior to significant  eval/treatment.   This is hospital day 7.  Small bowel obstruction Exploratory laparotomy with ileum and cecal resection POD 2 Overnight, patient found to be in atrial fibrillation with RVR (see below). Also had unwitnessed fall without NGT (see below). This morning, he is well-appearing, reports having a well-formed BM. Mild abdominal distension. Pain is well-controlled. CT abdomen/pelvis ordered overnight demonstrated expected post-operative findings of air and free fluid as well as interval improvement in small bowel dilation. Evaluated by PT who recommend SNF vs HHPT pending progress with additional therapy. Hemoglobin 10.6 this morning, up from 7.4 yesterday, after 2 u  pRBCs. Per surgery, ok to leave NG tube out. Resume TPN, advance diet to clear liquids.  - Surgery on board, appreciate recommendations  - Resume TPN  - Clear liquid diet  - lidocaine patch  - morphine 2-4 mg q2h PRN, methocarbamol 500 mg q6h PRN - D5 1/2 NS with KCl 20 mEq @ 100 cc/hr - Encourage OOB - Incentive spirometer - f/u surgical pathology  New-onset atrial fibrillation with RVR Overnight, patient found to be in atrial fibrillation with RVR. Asymptomatic, mild hypoxia. Mild hypomagnesemia, hypocalcemia, no other electrolyte abnormalities. TPN may have contributed to electrolyte derangements since last CMP so was stopped. CHADSVASC score of 0. Patient with relative hypotension, which is his baseline, so beta blocker not considered. Amiodarone ordered, however patient spontaneously converted back to NSR with HR in the 100s to 110s. TSH wnl. CXR demonstrated bilateral pleural effusions which were also seen in CT chest. On evaluation this morning, patient on room air breathing comfortably. Suspect afib precipitated by recent surgery.  - Continue telemetry  Unwitnessed fall Overnight, patient had unwitnessed fall, found on the floor with his NG tube out after attempting to go to the bathroom independently. Patient states he did not hit his head or lose consciousness. CTH without intracranial abnormality.  - Fall precautions - Continue to monitor  Mood disorder Bipolar disorder with psychosis Aggressive behavior Per psych re-eval on 10/26, patient had capacity to make an informed medical decision regarding his surgery. They note capacity may change from day-to-day. Patient's mother currently working on obtaining guardianship.  - Patient on monthly aripiprazole (Abilify), last dose 12/28/19 - Consider IV Haldol or IV benzodiazepine if patient becomes agitated, per psych  FEN/GI - D5 1/2 NS with KCl 20 mEq @ 100 cc/hr - Replenish to Mg >2 and K>4  - Clear liquid diet - TPN per  pharmacy - Patient at risk for refeeding. Monitor Phos.   VTE: SCDs Code: Full  Please contact the on call pager after 5 pm and on weekends at [phone].  Alexandria Lodge, MD PGY-1 Internal Medicine Teaching Service Pager: [phone] 01/13/2020

## 2020-01-13 NOTE — Significant Event (Addendum)
Paged for new A fib with RVR with rate as high as 160s.  Subjective: Patient resting comfortably in bed, watching television. Denies chest pain, palpitations, SOB. No new symptoms over past day, complains only of continued discomfort from NG tube.  Hgb drop from 9.1 > 7.4 today, had surgery 2 days ago. Received 2 units pRBCs for relative hypotension.  Also on TPN for past 2 days.  Objective: Blood pressures still soft but not worse than prior. BP of 82/60. Pulse ranged from 130s-140s.  On telemetry review, he converted around 10pm. No evidence of prior Afib on tele. EKG with A fib with RVR, no ST changes. Morning BMP with mild hypoMg of 1.6, repleted with 1g mg sulfate. On exam has irregularly irregular rhythm, no abnormal heart sounds. Patient appears comfortable.   Assessment: New onset A rib with RVR, 2 days post op from ex lap with small bowel resection and anastamosis. Mild hypomagnesemia, hypocalcemia, no other electrolyte abnormalities. TPN may have contributed to electrolyte derangements since last CMP.  Patient asymptomatic. CHADSVASC score of 0.  Relative hypotension, so will treat with caution.  Plan: -stop TPN for now -1L LR bolus -1g mg sulfate -f/u BMP, Mg, Ph, CBC, TSH, troponin -if BP will tolerate in 30 minutes, start Lopressor 12.5mg  BID -no anticoagulation given CHADSVASC score of 0  Addendum:  Remains asymptomatic, blood pressure similar at 86/60. Pulse mildly improved at 120s-130s but still in A fib with RVR. Good response to transfusion, hgb 7.4 > 10.6. Leukocytosis of 11.6. No fever.  Electrolytes without severe abnormality, Mg of 1.7.  Normal TSH, troponin CXR with pleural effusion and patchy opacities, possible pneumomediastinum  -f/u Blood cultures and CT abd/pelv for leukocytosis -f/u CT chest  -start amiodarone drip  Addendum: Patient spontaneously converted back to sinus tachycardia with rate in 100s around 3am CT with moderate air and fluid in  the abdomen. Also moderate bilateral pleural effusions. Spoke with general surgery, they will review his imaging.  -f/u surgery recs
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
## 2020-01-13 NOTE — Progress Notes (Signed)
PHARMACY - TOTAL PARENTERAL NUTRITION CONSULT NOTE  Indication: Prolonged ileus  Patient Measurements: Weight: 61.2 kg (135 lb)   Body mass index is 19.37 kg/m.  Assessment:  64 yom presented to the hospital with abdominal pain, nausea and vomiting. He has a history of SBO with ~20lb unintentional weight loss over the past month. Patient also with longstanding diarrhea. S/p >2 hour surgery 10/26 for mesenteric mass with anticipated ileus. Pt has been without PO intake for more than 6 days and is at risk for refeeding.   TPN held after infusing for 5.5 hours because MD suspects TPN causing electrolytes derangements leading to Afib RVR.  Glucose / Insulin: no hx DM - CBGs at goal, has not yet required SSI (CBGs low normal off TPN) Electrolytes: refeeding - Na low normal, Mag 1.9 post Mag 3gm this AM (goal >/= 2 for ileus), Phos down to 2.1, others WNL Renal: SCr 0.73, BUN WNL LFTs / TGs: LFTs / TG WNL, tbili 1.3   Prealbumin / albumin: albumin 1.4, prealbumin <5 Intake / Output; MIVF: D51/2NS 20K at 12ml/hr, UOP 0.4, LBM 10/23, NG O/P 237mL (pulled out 10/28 AM) GI Imaging: 10/21 CT - severe small bowel dilatation consistent with obstruction, enlarged mesenteric lymph nodes Surgeries / Procedures:  10/26: ex-lap with ileum/cecal resection  Central access: PICC placed 01/12/20 TPN start date: 01/12/20  Nutritional Goals (per RD rec on 10/27): 1900-2100 kCal, 100-120gm protein, >/= 1.9L per day  Current Nutrition:  TPN  Plan:  Continue TPN at 64mL/hr due to refeeding syndrome and patient only received partial TPN on day #1 (goal rate 85 ml/hr) TPN will provide 39g AA, 97g CHO and 22g ILE for a total lof 702 kCal, meeting ~35% of needs Electrolytes in TPN: Na 61mEq/L, K 87mEq/L, Ca 45mEq/L, incr Mag 79mEq/L, incr Phos 30mmol/L, Cl:Ac 1:1 Add standard MVI and trace elements to TPN Continue sensitive SSI Q4H Change IVF to D5NS 10K at 70 ml/hr - taper down as TPN rate is  advanced KPhos 25 mmol IV x 1 (no NaPhos with critical shortage, provides ~32mEq K) Additional mag sulfate 2gm IV today F/U AM labs  Tyler Dominguez, PharmD, BCPS, [place] 01/13/2020, 11:02 AM

## 2020-01-13 NOTE — Evaluation (Addendum)
Exp lap with SB resection 10/27. Diffuse bruising along LLQ flack and posterior back. Area taut and swollen. Patient denies pain on palpation. BP 102/70, RR 16, 134 pulse, 100% O2 IMTS on-calll Agyei, MD  notified via [employer]. No change in patient orders. Will continue to assess for further changes in patient status. Area marked with appropriate skin marker to assess for progression or regression bruising  of effected area.

## 2020-01-14 ENCOUNTER — Encounter: Payer: Self-pay | Admitting: Gastroenterology

## 2020-01-14 ENCOUNTER — Inpatient Hospital Stay (HOSPITAL_COMMUNITY): Payer: Medicaid Other

## 2020-01-14 DIAGNOSIS — E8809 Other disorders of plasma-protein metabolism, not elsewhere classified: Secondary | ICD-10-CM

## 2020-01-14 DIAGNOSIS — K56609 Unspecified intestinal obstruction, unspecified as to partial versus complete obstruction: Secondary | ICD-10-CM | POA: Diagnosis not present

## 2020-01-14 DIAGNOSIS — K50012 Crohn's disease of small intestine with intestinal obstruction: Secondary | ICD-10-CM | POA: Diagnosis not present

## 2020-01-14 LAB — GLUCOSE, CAPILLARY
Glucose-Capillary: 109 mg/dL — ABNORMAL HIGH (ref 70–99)
Glucose-Capillary: 109 mg/dL — ABNORMAL HIGH (ref 70–99)
Glucose-Capillary: 123 mg/dL — ABNORMAL HIGH (ref 70–99)
Glucose-Capillary: 106 mg/dL — ABNORMAL HIGH (ref 70–99)

## 2020-01-14 LAB — IRON AND TIBC: Iron: 27 ug/dL — ABNORMAL LOW (ref 45–182)

## 2020-01-14 LAB — MISC LABCORP TEST (SEND OUT)
LabCorp test name: 5
Labcorp test code: 316205

## 2020-01-14 LAB — VITAMIN B12: Vitamin B-12: 1841 pg/mL — ABNORMAL HIGH (ref 180–914)

## 2020-01-14 LAB — BASIC METABOLIC PANEL
Anion gap: 6 (ref 5–15)
BUN: 8 mg/dL (ref 6–20)
CO2: 26 mmol/L (ref 22–32)
Calcium: 7.1 mg/dL — ABNORMAL LOW (ref 8.9–10.3)
Chloride: 103 mmol/L (ref 98–111)
Creatinine, Ser: 0.81 mg/dL (ref 0.61–1.24)
GFR, Estimated: >60 mL/min (ref >60)
Glucose, Bld: 108 mg/dL — ABNORMAL HIGH (ref 70–99)
Potassium: 3.8 mmol/L (ref 3.5–5.1)
Sodium: 135 mmol/L (ref 135–145)

## 2020-01-14 LAB — CBC
HCT: 30 % — ABNORMAL LOW (ref 39.0–52.0)
Hemoglobin: 10.4 g/dL — ABNORMAL LOW (ref 13.0–17.0)
MCH: 30.5 pg (ref 26.0–34.0)
MCHC: 34.7 g/dL (ref 30.0–36.0)
MCV: 88 fL (ref 80.0–100.0)
Platelets: 276 10*3/uL (ref 150–400)
RBC: 3.41 MIL/uL — ABNORMAL LOW (ref 4.22–5.81)
RDW: 18.8 % — ABNORMAL HIGH (ref 11.5–15.5)
WBC: 14.1 10*3/uL — ABNORMAL HIGH (ref 4.0–10.5)
nRBC: 0 % (ref 0.0–0.2)

## 2020-01-14 LAB — HEPATITIS B SURFACE ANTIGEN: Hepatitis B Surface Ag: NONREACTIVE

## 2020-01-14 LAB — RETICULOCYTES
Immature Retic Fract: 21.3 % — ABNORMAL HIGH (ref 2.3–15.9)
RBC.: 3.47 MIL/uL — ABNORMAL LOW (ref 4.22–5.81)
Retic Count, Absolute: 104.1 10*3/uL (ref 19.0–186.0)
Retic Ct Pct: 3 % (ref 0.4–3.1)

## 2020-01-14 LAB — MAGNESIUM: Magnesium: 1.8 mg/dL (ref 1.7–2.4)

## 2020-01-14 LAB — PHOSPHORUS: Phosphorus: 2.3 mg/dL — ABNORMAL LOW (ref 2.5–4.6)

## 2020-01-14 LAB — HEPATITIS B SURFACE ANTIBODY,QUALITATIVE: Hep B S Ab: REACTIVE — AB

## 2020-01-14 LAB — FERRITIN: Ferritin: 177 ng/mL (ref 24–336)

## 2020-01-14 LAB — FOLATE: Folate: 4.5 ng/mL — ABNORMAL LOW (ref >5.9)

## 2020-01-14 LAB — HEPATITIS C ANTIBODY: HCV Ab: NONREACTIVE

## 2020-01-14 MED ORDER — POTASSIUM PHOSPHATES 15 MMOLE/5ML IV SOLN
30.0000 mmol | Freq: Once | INTRAVENOUS | Status: AC
  Administered 2020-01-14: 30 mmol via INTRAVENOUS
  Filled 2020-01-14: qty 10

## 2020-01-14 MED ORDER — TRAVASOL 10 % IV SOLN
INTRAVENOUS | Status: AC
  Filled 2020-01-14: qty 648

## 2020-01-14 MED ORDER — MAGNESIUM SULFATE 4 GM/100ML IV SOLN
4.0000 g | Freq: Once | INTRAVENOUS | Status: AC
  Administered 2020-01-14: 4 g via INTRAVENOUS
  Filled 2020-01-14: qty 100

## 2020-01-14 MED ORDER — INSULIN ASPART 100 UNIT/ML ~~LOC~~ SOLN
0.0000 [IU] | Freq: Four times a day (QID) | SUBCUTANEOUS | Status: DC
  Administered 2020-01-14: 1 [IU] via SUBCUTANEOUS

## 2020-01-14 MED ORDER — POTASSIUM PHOSPHATES 15 MMOLE/5ML IV SOLN: 20.0000 mmol | Freq: Once | INTRAVENOUS | Status: DC

## 2020-01-14 MED ORDER — MAGNESIUM SULFATE 2 GM/50ML IV SOLN: 2.0000 g | Freq: Once | INTRAVENOUS | Status: DC
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
## 2020-01-14 NOTE — Evaluation (Signed)
It was noted that the bruising on patients back had grown beyond the outline that the previous shift had drawn. MD made aware, no new orders given. Will continue to monitor patient. Vitals stable and patient not in any pain.

## 2020-01-14 NOTE — Progress Notes (Signed)
HD#8 Subjective:   No acute events overnight. Diffuse bruising along LLQ flank and posterior back with overlying swelling noted by nursing. Patient denied pain on palpation. Area marked with skin marker to assess for progression or regression of bruising.  Surgical pathology resulted, demonstrates Crohn's ileitis. Patient informed by surgery during their rounds this morning. [facility] consulted.  During our evaluation at bedside during rounds, patient states he is "doing good," has no complaints. States he has been doing well with his jello. Had another BM this morning. Denies abdominal pain or pain over site of bruising on L flank. Provided additional instruction regarding incentive spirometer, and he demonstrated using it.  Objective:   Vital signs in last 24 hours: Vitals:   01/13/20 1409 01/13/20 1458 01/13/20 2102 01/14/20 0600  BP: 111/88 95/74 102/78 102/73  Pulse: (!) 134 96 (!) 134 (!) 109  Resp: 20 20 18 18   Temp: 98 F (36.7 C) 98 F (36.7 C) 98.6 F (37 C) 98 F (36.7 C)  TempSrc:      SpO2: 96% 97%  94%  Weight:       Physical Exam Constitutional: Patient appears comfortable, sitting upright in bed, no acute distress CV: tachycardic in the 100s, no m/r/g, no lower extremity edema Lungs: normal work of breathing on room air, lungs sounds diminished in bilateral bases Abdominal: mildly distended, bowel sounds hypoactive. R flank with diffuse bruising, now extending beyond skin marker. No tenderness to palpation. Area is soft.  Pertinent Labs: CBC Latest Ref Rng & Units 01/14/2020 01/13/2020 01/13/2020  WBC 4.0 - 10.5 K/uL 14.1(H) 16.2(H) 11.6(H)  Hemoglobin 13.0 - 17.0 g/dL 10.4(L) 10.7(L) 10.6(L)  Hematocrit 39 - 52 % 30.0(L) 31.7(L) 31.2(L)  Platelets 150 - 400 K/uL 276 311 307    CMP Latest Ref Rng & Units 01/14/2020 01/13/2020 01/13/2020  Glucose 70 - 99 mg/dL 108(H) 91 125(H)  BUN 6 - 20 mg/dL 8 7 7   Creatinine 0.61 - 1.24 mg/dL 0.81 0.73 0.69   Sodium 135 - 145 mmol/L 135 135 135  Potassium 3.5 - 5.1 mmol/L 3.8 4.0 4.0  Chloride 98 - 111 mmol/L 103 102 105  CO2 22 - 32 mmol/L 26 27 27   Calcium 8.9 - 10.3 mg/dL 7.1(L) 7.2(L) 7.2(L)  Total Protein 6.5 - 8.1 g/dL - 3.9(L) -  Total Bilirubin 0.3 - 1.2 mg/dL - 1.3(H) -  Alkaline Phos 38 - 126 U/L - 87 -  AST 15 - 41 U/L - 16 -  ALT 0 - 44 U/L - 19 -   Mg: 1.8 Phos: 2.3  DG Chest 1 View  Result Date: 01/14/2020 CLINICAL DATA:  Pleural effusion. CT 01/13/2020. Chest x-ray 01/13/2020. EXAM: CHEST  1 VIEW COMPARISON:  None. FINDINGS: Interval removal of NG tube. Right PICC line stable position. Heart size stable. Persistent but improved bilateral pulmonary infiltrates/edema. Persistent left-sided pleural effusion, also with slight interim improvement. Persistent free air under the right hemidiaphragm consistent with recent laparotomy. IMPRESSION: 1. Interval removal of NG tube. Right PICC line stable position. 2. Persistent but improved bilateral pulmonary infiltrates/edema. Persistent left-sided pleural effusion, also with slight interim improvement. 3. Persistent free air under the right hemidiaphragm consistent with recent laparotomy. Electronically Signed   By: [place]   On: 01/14/2020 05:41   SURGICAL PATHOLOGY  CASE: [id number]  PATIENT: Tyler Dominguez  Surgical Pathology Report   Clinical History: small bowel obstruction (cm)   FINAL MICROSCOPIC DIAGNOSIS:   A. ILEUM AND CECUM, RESECTION:  -  Findings consistent with Crohn's disease, see comment.  - Benign lymph nodes.  - No dysplasia or malignancy.   COMMENT:   There is chronic active enteritis with ulcers and fistulas consistent  with Crohn's disease. These changes involve the appendix. There is  fibrosis and mixed inflammation in the mesentery related to overall  inflammation and fistula formation. Dr. Saralyn Pilar has reviewed the case.    Assessment/Plan:   Principal Problem:   SBO (small bowel  obstruction) (HCC) Active Problems:   Aggressive behavior   Bipolar affective disorder, depressed, mild (HCC)   Hypoalbuminemia   Protein-calorie malnutrition, severe   Patient Summary: Tyler Dominguez is a 39 y.o. male with a pertinent PMH of SBO and bipolar disorder with pyschosis who presented with one week of abdominal cramping, distension and nausea and was admitted for SBO. Per chart review, he has had numerous past visits for SBO but left AMA prior to significant eval/treatment.   This is hospital day 8.  Small bowel obstruction secondary to mesenteric mass Exploratory laparotomy with ileum and cecal resection POD 3 Surgical pathology consistent with Crohn's disease  Aside from intermittent tachycardia, no further episodes of afib. Patient is well-appearing, states pain is well-controlled. Having well-formed BMs.Tolerating clear liquid diet. Mild abdominal distension. Now with non-tender R flank with diffuse bruising. Hemoglobin stable at 10.4 this morning. Repeat CXR this morning demonstrates interval improvement in L pleural effusion.  PT recommend SNF. Per surgery, advance to full liquid diet and discontinue TPN.   Patient evaluated by GI given surgical pathology demonstrating Crohn's ileitis. They are obtaining labs to prepare for future biologic therapy and to screen for vitamin D and iron deficiency anemia. Plan for outpatient follow-up on 03/09/20 to discuss further staging and disease management. Recommend avoiding NSAIDs as able and working to maximize nutritional status. - Hgb stable, continue to monitor R flank bruising - Surgery on board, appreciate recommendations  - Full liquid diet  - lidocaine patch  - morphine 2-4 mg q2h PRN, methocarbamol 500 mg q6h PRN - Encourage OOB - Incentive spirometer - GI consulted, appreciate recs  - Avoid NSAIDs  - Maximize nutritional status  - Quantiferon-TB Gold, Hepatitis C antibody, Vitamin D 25-OH pending  - Folate low at 4.5,  B12 normal  - Ferritin wnl at 177, iron low at 27, retic ct 3.0  - Hepatitis B surface antigen negative, Hepatitis B surface antibody positive  - Hepatitis C antibody negative  New-onset atrial fibrillation with RVR Patient with new-onset Afib with RVR overnight 10/27-28. Resolved spontaneously. Has had sporadic sinus tachycardia since but no recurrent episodes of afib. - Continue telemetry  Unwitnessed fall Overnight 10/27-28, patient had unwitnessed fall, found on the floor with his NG tube out after attempting to go to the bathroom independently.  CTH without intracranial abnormality.  - Fall precautions - Continue to monitor  Mood disorder Bipolar disorder with psychosis Aggressive behavior - Patient on monthly aripiprazole (Abilify), last dose 12/28/19 - Consider IV Haldol or IV benzodiazepine if patient becomes agitated, per psych  FEN/GI - Replenish to Mg >2 and K>4  - Full liquid diet - d/c TPN - Patient at risk for refeeding. Monitor Phos.   VTE: SCDs Code: Full  Please contact the on call pager after 5 pm and on weekends at [phone].  Alexandria Lodge, MD PGY-1 Internal Medicine Teaching Service Pager: [phone] 01/14/2020

## 2020-01-14 NOTE — Plan of Care (Signed)
  Problem: Education: Goal: Knowledge of General Education information will improve Description: Including pain rating scale, medication(s)/side effects and non-pharmacologic comfort measures Outcome: Progressing   Problem: Clinical Measurements: Goal: Will remain free from infection Outcome: Progressing   Problem: Nutrition: Goal: Adequate nutrition will be maintained Outcome: Progressing   Problem: Education: Goal: Required Educational Video(s) Outcome: Progressing   Problem: Clinical Measurements: Goal: Postoperative complications will be avoided or minimized Outcome: Progressing   Problem: Skin Integrity: Goal: Demonstration of wound healing without infection will improve Outcome: Progressing
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
## 2020-01-14 NOTE — TOC Progression Note (Signed)
Transition of Care [facility]) - Progression Note    Patient Details  Name: Tyler Dominguez MRN: [id number] Date of Birth: 10-Jun-1980  Transition of Care [facility]) CM/SW Contact  Joanne Chars, LCSW Phone Number: 01/14/2020, 3:56 PM  Clinical Narrative:   CSW spoke with pt and his mother regarding SNF recommendation.  They decline SNF but are willing to have [facility].  Mother reports pt has someone in the home to help 24/7.     Expected Discharge Plan:  (undetermined) Barriers to Discharge: Continued Medical Work up  Expected Discharge Plan and Services Expected Discharge Plan:  (undetermined)     Post Acute Care Choice:  (waiting on recommendations) Living arrangements for the past 2 months: Single Family Home                                       Social Determinants of Health (SDOH) Interventions    Readmission Risk Interventions No flowsheet data found.

## 2020-01-14 NOTE — Progress Notes (Addendum)
PHARMACY - TOTAL PARENTERAL NUTRITION CONSULT NOTE  Indication: Prolonged ileus  Patient Measurements: Weight: 61.2 kg (135 lb)   Body mass index is 19.37 kg/m.  Assessment:  31 yom presented to the hospital with abdominal pain, nausea and vomiting. He has a history of SBO with ~20lb unintentional weight loss over the past month. Patient also with longstanding diarrhea. S/p >2 hour surgery 10/26 for mesenteric mass with anticipated ileus. Pt has been without PO intake for more than 6 days and is at risk for refeeding.   Glucose / Insulin: no hx DM - CBGs < 180.  Used 1 unit SSI. Electrolytes: refeeding - Na low normal, K 3.8 (goal > /= 4 for ileus), Mag 1.8 post Mag 5gm yeterday (goal >/= 2 for ileus), Phos up to 2.2, others WNL Renal: SCr 0.73, BUN WNL LFTs / TGs: LFTs / TG WNL, tbili 1.3   Prealbumin / albumin: albumin 1.4, prealbumin <5 Intake / Output; MIVF: D51/2NS 20K at 65ml/hr, UOP 59mL, NG pulled out 10/28 AM, LBM 10/28 GI Imaging: 10/21 CT - severe small bowel dilatation consistent with obstruction, enlarged mesenteric lymph nodes Surgeries / Procedures:  10/26: ex-lap with ileum/cecal resection  Central access: PICC placed 01/12/20 TPN start date: 01/12/20 (partial TPN bad on 10/27-10/28)  Nutritional Goals (per RD rec on 10/27): 1900-2100 kCal, 100-120gm protein, >/= 1.9L per day  Current Nutrition:  TPN Clear liquid diet  Plan:  Increase TPN to 50 ml/hr, advance slowly with refeeding (goal rate 85 ml/hr) TPN will provide 65g AA, 162g CHO and 36g ILE for a total lof 1170 kCal, meeting ~60% of needs Electrolytes in TPN: Na 17mEq/L, K incr 62mEq/L (= 84 mEq/d), Ca 65mEq/L, incr Mag 65mEq/L (= ~2 g/d), incr Phos 76mmol/L (= 30 mmol/d), Cl:Ac 1:1  Add standard MVI and trace elements to TPN Reduce sensitive SSI to Q6H D5NS 10K at 70 ml/hr on hold since last night for unknown reason >> D/C today, would help with Na KPhos 30 mmol IV x 1 (no NaPhos with critical  shortage) Mag sulfate 4gm IV x 1 F/U AM labs, diet advancement  Saladin Petrelli D. Mina Marble, PharmD, BCPS, [place] 01/14/2020, 7:48 AM  ==========================  Addendum: Surgery advancing to full liquid diet F/U with PO intake/diet advancement, might not need to increase TPN to goal rate  Eunie Lawn D. Mina Marble, PharmD, BCPS, [place] 01/14/2020, 9:10 AM

## 2020-01-14 NOTE — Plan of Care (Signed)
  Problem: Education: Goal: Knowledge of General Education information will improve Description: Including pain rating scale, medication(s)/side effects and non-pharmacologic comfort measures Outcome: Not Progressing   Problem: Clinical Measurements: Goal: Will remain free from infection Outcome: Not Progressing   Problem: Nutrition: Goal: Adequate nutrition will be maintained Outcome: Not Progressing   Problem: Education: Goal: Required Educational Video(s) Outcome: Not Progressing   Problem: Clinical Measurements: Goal: Postoperative complications will be avoided or minimized Outcome: Not Progressing   Problem: Skin Integrity: Goal: Demonstration of wound healing without infection will improve Outcome: Not Progressing

## 2020-01-15 LAB — CBC
HCT: 28.1 % — ABNORMAL LOW (ref 39.0–52.0)
Hemoglobin: 9.4 g/dL — ABNORMAL LOW (ref 13.0–17.0)
MCH: 30.7 pg (ref 26.0–34.0)
MCHC: 33.5 g/dL (ref 30.0–36.0)
MCV: 91.8 fL (ref 80.0–100.0)
Platelets: 245 10*3/uL (ref 150–400)
RBC: 3.06 MIL/uL — ABNORMAL LOW (ref 4.22–5.81)
RDW: 18.4 % — ABNORMAL HIGH (ref 11.5–15.5)
WBC: 9.7 10*3/uL (ref 4.0–10.5)
nRBC: 0.2 % (ref 0.0–0.2)

## 2020-01-15 LAB — MAGNESIUM: Magnesium: 2.3 mg/dL (ref 1.7–2.4)

## 2020-01-15 LAB — VITAMIN D 25 HYDROXY (VIT D DEFICIENCY, FRACTURES): Vit D, 25-Hydroxy: 8.84 ng/mL — ABNORMAL LOW (ref 30–100)

## 2020-01-15 LAB — BASIC METABOLIC PANEL
Anion gap: 8 (ref 5–15)
BUN: 6 mg/dL (ref 6–20)
CO2: 25 mmol/L (ref 22–32)
Calcium: 7.2 mg/dL — ABNORMAL LOW (ref 8.9–10.3)
Chloride: 101 mmol/L (ref 98–111)
Creatinine, Ser: 0.72 mg/dL (ref 0.61–1.24)
GFR, Estimated: >60 mL/min (ref >60)
Glucose, Bld: 102 mg/dL — ABNORMAL HIGH (ref 70–99)
Potassium: 4.4 mmol/L (ref 3.5–5.1)
Sodium: 134 mmol/L — ABNORMAL LOW (ref 135–145)

## 2020-01-15 LAB — PHOSPHORUS: Phosphorus: 3.8 mg/dL (ref 2.5–4.6)

## 2020-01-15 LAB — GLUCOSE, CAPILLARY
Glucose-Capillary: 100 mg/dL — ABNORMAL HIGH (ref 70–99)
Glucose-Capillary: 103 mg/dL — ABNORMAL HIGH (ref 70–99)
Glucose-Capillary: 105 mg/dL — ABNORMAL HIGH (ref 70–99)
Glucose-Capillary: 106 mg/dL — ABNORMAL HIGH (ref 70–99)

## 2020-01-15 NOTE — Progress Notes (Signed)
HD#9 Subjective:   No acute events overnight.   During rounds this morning, patient is sitting in bedside chair, appears comfortable. Denies abdominal pain, back pain, nausea/vomiting. States he is getting tired of eating jello and that his BMs have been loose. Asks repeatedly when he will be going home.   Objective:   Vital signs in last 24 hours: Vitals:   01/13/20 2102 01/14/20 0600 01/14/20 1504 01/14/20 2126  BP: 102/78 102/73 99/72 103/78  Pulse: (!) 134 (!) 109 (!) 101 97  Resp: 18 18 17 18   Temp: 98.6 F (37 C) 98 F (36.7 C) 98.1 F (36.7 C) 97.7 F (36.5 C)  TempSrc:    Oral  SpO2:  94% 98% 99%  Weight:       Physical Exam Constitutional: Patient appears comfortable, sitting upright in bedside chair, no acute distress CV: tachycardic in the 100s, no m/r/g, 2+ bilateral lower extremity edema to the mid shins Lungs: normal work of breathing on room air, lungs sounds diminished in bilateral bases Abdominal: moderately distended but soft, bowel sounds hyperactive, bowel sounds can be heard even without stethoscope. L flank with improved bruising. No tenderness to palpation. Area is soft.  Pertinent Labs: CBC Latest Ref Rng & Units 01/15/2020 01/14/2020 01/13/2020  WBC 4.0 - 10.5 K/uL 9.7 14.1(H) 16.2(H)  Hemoglobin 13.0 - 17.0 g/dL 9.4(L) 10.4(L) 10.7(L)  Hematocrit 39 - 52 % 28.1(L) 30.0(L) 31.7(L)  Platelets 150 - 400 K/uL 245 276 311    CMP Latest Ref Rng & Units 01/15/2020 01/14/2020 01/13/2020  Glucose 70 - 99 mg/dL 102(H) 108(H) 91  BUN 6 - 20 mg/dL 6 8 7   Creatinine 0.61 - 1.24 mg/dL 0.72 0.81 0.73  Sodium 135 - 145 mmol/L 134(L) 135 135  Potassium 3.5 - 5.1 mmol/L 4.4 3.8 4.0  Chloride 98 - 111 mmol/L 101 103 102  CO2 22 - 32 mmol/L 25 26 27   Calcium 8.9 - 10.3 mg/dL 7.2(L) 7.1(L) 7.2(L)  Total Protein 6.5 - 8.1 g/dL - - 3.9(L)  Total Bilirubin 0.3 - 1.2 mg/dL - - 1.3(H)  Alkaline Phos 38 - 126 U/L - - 87  AST 15 - 41 U/L - - 16  ALT 0 - 44 U/L -  - 19   Mg: 2.3<1.8<1.9<1.7 Phos: 3.8<2.3<2.1<2.5  Assessment/Plan:   Principal Problem:   SBO (small bowel obstruction) (HCC) Active Problems:   Aggressive behavior   Bipolar affective disorder, depressed, mild (HCC)   Hypoalbuminemia   Protein-calorie malnutrition, severe   Crohn's disease of small intestine with intestinal obstruction (HCC)   Patient Summary: Tyler Dominguez is a 39 y.o. male with a pertinent PMH of SBO and bipolar disorder with pyschosis who presented with one week of abdominal cramping, distension and nausea and was admitted for SBO. Per chart review, he had numerous past visits for SBO but left AMA prior to significant eval/treatment.   This is hospital day 9.  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 Patient is well-appearing, states pain is well-controlled. Having BMs, albeit loose. Full clear liquid diet. Moderate abdominal distension with hyperactive bowel sounds. L flank bruising stable from yesterday. Hemoglobin 9.4 from 10.4 yesterday, appears dilutional. PT recommend SNF but family prefers [facility] services. Per surgery, advance to soft diet and discontinue TPN. Per surgery, patient is stable for discharge if he is tolerating a soft  diet and will arrange for follow-up with Dr. Georgette Dover as well as RN for staple removal.  Patient evaluated by GI given surgical pathology demonstrating Crohn's ileitis. They are obtaining labs to prepare for future biologic therapy and to screen for vitamin D and iron deficiency anemia. Plan for outpatient follow-up on 03/09/20 to discuss further staging and disease management. Recommend avoiding NSAIDs as able and working to maximize nutritional status. - continue to monitor R flank bruising - Surgery on board, appreciate recommendations  - Advance  to soft diet  - lidocaine patch; morphine 2-4 mg q2h PRN, methocarbamol 500 mg q6h PRN - Encourage OOB - Incentive spirometer - GI consulted, appreciate recs  - Avoid NSAIDs  - Maximize nutritional status  - Quantiferon-TB Gold, Vitamin D 25-OH pending  - Folate low at 4.5, B12 normal  - Ferritin wnl at 177, iron low at 27, retic ct 3.0  - Hepatitis B surface antigen negative, Hepatitis B surface antibody positive  - Hepatitis C antibody negative  New-onset atrial fibrillation with RVR Patient with new-onset Afib with RVR overnight 10/27-28. Resolved spontaneously. Has had sporadic sinus tachycardia since but no recurrent episodes of afib. - Continue telemetry  Unwitnessed fall Overnight 10/27-28, patient had unwitnessed fall, found on the floor with his NG tube out after attempting to go to the bathroom independently.  CTH without intracranial abnormality.  - Fall precautions - Continue to monitor  Mood disorder Bipolar disorder with psychosis Aggressive behavior - Patient on monthly aripiprazole (Abilify), last dose 12/28/19 - Consider IV Haldol or IV benzodiazepine if patient becomes agitated, per psych  FEN/GI - Replenish to Mg >2 and K>4  - Advance to soft diet - d/c TPN - Patient at risk for refeeding. Monitor Phos.  Diet: Soft diet IVF: None VTE: Enoxaparin Code: Full PT/OT recs: SNF   24/7 supervision; patient and family decline, prefer HH services   Dispo: Anticipated discharge to Home with home health services in 1 days pending tolerance of soft diet.    Please contact the on call pager after 5 pm and on weekends at [phone].  Alexandria Lodge, MD PGY-1 Internal Medicine Teaching Service Pager: [phone] 01/15/2020

## 2020-01-15 NOTE — Progress Notes (Signed)
PHARMACY - TOTAL PARENTERAL NUTRITION CONSULT NOTE  Indication: Prolonged ileus  Patient Measurements: Weight: 61.2 kg (135 lb)   Body mass index is 19.37 kg/m.  Assessment:  37 yom presented to the hospital with abdominal pain, nausea and vomiting. He has a history of SBO with ~20lb unintentional weight loss over the past month. Patient also with longstanding diarrhea. S/p >2 hour surgery 10/26 for mesenteric mass with anticipated ileus. Pt has been without PO intake for more than 6 days and is at risk for refeeding. Diagnosed with Crohn's ileitis on 10/29.  Glucose / Insulin: no hx DM - CBGs < 180.  Used 1 unit SSI. Electrolytes: refeeding - Na low normal, K 3.8 (goal > /= 4 for ileus), Mag 1.8 post Mag 5gm yeterday (goal >/= 2 for ileus), Phos up to 2.2, others WNL Renal: SCr 0.81, BUN WNL LFTs / TGs: LFTs / TG WNL, tbili 1.3   Prealbumin / albumin: albumin 1.4, prealbumin <5 Intake / Output; MIVF: D51/2NS 20K at 68ml/hr, UOP 39mL, NG pulled out 10/28 AM, LBM 10/28 GI Imaging: 10/21 CT - severe small bowel dilatation consistent with obstruction, enlarged mesenteric lymph nodes Surgeries / Procedures:  10/26: ex-lap with ileum/cecal resection  Central access: PICC placed 01/12/20 TPN start date: 01/12/20 (partial TPN bag on 10/27-10/28)  Nutritional Goals (per RD rec on 10/27): 1900-2100 kCal, 100-120gm protein, >/= 1.9L per day  Current Nutrition:  TPN Full liquid diet -> soft diet  Plan:  Decrease TPN rate to 25 ml/hr x 2 hrs then off - spoke with RN Discontinue TPN and SSI Discontinue TPN labs  Thank you for involving pharmacy in this patient's care.  Renold Genta, PharmD, BCPS Clinical Pharmacist Clinical phone for 01/15/2020 until 3p is [phone] 01/15/2020 7:29 AM  **Pharmacist phone directory can be found on [email or web address] listed under [place]**

## 2020-01-15 NOTE — Hospital Course (Signed)
POD #4 of cecum and illeum. Surgery cleared for discharge.  Planning for discharge tomorrow.   Post op anemia 7.4 s/p 2U PRBC  Afib with RVR--likely 2/2 surgery
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
## 2020-01-16 LAB — CBC
HCT: 28.6 % — ABNORMAL LOW (ref 39.0–52.0)
Hemoglobin: 9.5 g/dL — ABNORMAL LOW (ref 13.0–17.0)
MCH: 30.6 pg (ref 26.0–34.0)
MCHC: 33.2 g/dL (ref 30.0–36.0)
MCV: 92.3 fL (ref 80.0–100.0)
Platelets: 233 10*3/uL (ref 150–400)
RBC: 3.1 MIL/uL — ABNORMAL LOW (ref 4.22–5.81)
RDW: 18.2 % — ABNORMAL HIGH (ref 11.5–15.5)
WBC: 8.5 10*3/uL (ref 4.0–10.5)
nRBC: 0 % (ref 0.0–0.2)

## 2020-01-16 LAB — QUANTIFERON-TB GOLD PLUS (RQFGPL)
QuantiFERON Mitogen Value: 4.62 IU/mL
QuantiFERON Nil Value: 0.04 IU/mL
QuantiFERON TB1 Ag Value: 0.03 IU/mL
QuantiFERON TB2 Ag Value: 0.04 IU/mL

## 2020-01-16 LAB — QUANTIFERON-TB GOLD PLUS: QuantiFERON-TB Gold Plus: NEGATIVE

## 2020-01-16 LAB — MAGNESIUM: Magnesium: 2.1 mg/dL (ref 1.7–2.4)

## 2020-01-16 NOTE — Progress Notes (Signed)
  Date: 01/16/2020  Patient name: Tyler Dominguez  Medical record number: [id number]  Date of birth: 12-28-1980        I have seen and evaluated this patient and I have discussed the plan of care with the house staff. Please see Dr. Chase Picket note for complete details. I concur with her findings and plan.    Sid Falcon, MD 01/16/2020, 9:25 PM

## 2020-01-16 NOTE — TOC Transition Note (Signed)
Transition of Care [facility]) - CM/SW Discharge Note   Patient Details  Name: Tyler Dominguez MRN: [id number] Date of Birth: [birth year]  Transition of Care [facility]) CM/SW Contact:  Claudie Leach, RN 01/16/2020, 12:36 PM   Clinical Narrative:    Patient to d/c home.  Mother at bedside assisting patient with d/c plans.  Home health arranged with Corene Cornea at [facility].  Mother states patient has PCP at [facility]- Dr. Nolene Ebbs.   Final next level of care: [place] Barriers to Discharge: No Barriers Identified   Patient Goals and CMS Choice Patient states their goals for this hospitalization and ongoing recovery are:: "get out of here"       Discharge Plan and Services     Post Acute Care Choice:  (waiting on recommendations)                    HH Arranged: PT, OT [place] Agency: [place] [facility]) Date HH Agency Contacted: 01/16/20 Time [place]: 3016 Representative spoke with at [place]: Corene Cornea

## 2020-01-16 NOTE — Plan of Care (Signed)
  Problem: Education: Goal: Knowledge of General Education information will improve Description: Including pain rating scale, medication(s)/side effects and non-pharmacologic comfort measures Outcome: Completed/Met   Problem: Clinical Measurements: Goal: Will remain free from infection Outcome: Completed/Met   Problem: Nutrition: Goal: Adequate nutrition will be maintained Outcome: Completed/Met   Problem: Education: Goal: Required Educational Video(s) Outcome: Completed/Met   Problem: Clinical Measurements: Goal: Postoperative complications will be avoided or minimized Outcome: Completed/Met   Problem: Skin Integrity: Goal: Demonstration of wound healing without infection will improve Outcome: Completed/Met
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
## 2020-01-16 NOTE — Discharge Instructions (Signed)
Thank you for allowing [place] to oversee your care this hospitalization. Please follow up with the surgeon as instructed. You will have a nurse visit in 10 days to remove the staples. Maintain a soft diet as you have been. Follow up with the gastroenterologist for further evaluation of the Chron's disease. I have attached some information regarding this.

## 2020-01-16 NOTE — Progress Notes (Signed)
RUE PICC d/c per order. Sight WNL, cleaned with CHG and covered with 2x2 and vaseline guaze.  Pt and mother verbalize understanding of site care, dressing care and when to call MD via teachback method.  RN aware

## 2020-01-16 NOTE — Progress Notes (Signed)
HD#10 Subjective:   No acute events overnight.  Able to eat breakfast this morning without abdominal pain.  Objective:   Vital signs in last 24 hours: Vitals:   01/14/20 1504 01/14/20 2126 01/15/20 1423 01/15/20 2148  BP: 99/72 103/78 103/83 113/79  Pulse: (!) 101 97 (!) 126 94  Resp: 17 18 16 20   Temp: 98.1 F (36.7 C) 97.7 F (36.5 C) 98.3 F (36.8 C) 97.9 F (36.6 C)  TempSrc:  Oral  Oral  SpO2: 98% 99% 95% 100%  Weight:       Physical Exam General: chronically ill appearing but in NAD Cardiac: tachycardic rate, regular rhythm. +2 edema to bilateral lower extremities Pulm: lungs clear bilaterally GI: bs active. Non-distended.  Pertinent Labs: CBC Latest Ref Rng & Units 01/16/2020 01/15/2020 01/14/2020  WBC 4.0 - 10.5 K/uL 8.5 9.7 14.1(H)  Hemoglobin 13.0 - 17.0 g/dL 9.5(L) 9.4(L) 10.4(L)  Hematocrit 39 - 52 % 28.6(L) 28.1(L) 30.0(L)  Platelets 150 - 400 K/uL 233 245 276    CMP Latest Ref Rng & Units 01/15/2020 01/14/2020 01/13/2020  Glucose 70 - 99 mg/dL 102(H) 108(H) 91  BUN 6 - 20 mg/dL 6 8 7   Creatinine 0.61 - 1.24 mg/dL 0.72 0.81 0.73  Sodium 135 - 145 mmol/L 134(L) 135 135  Potassium 3.5 - 5.1 mmol/L 4.4 3.8 4.0  Chloride 98 - 111 mmol/L 101 103 102  CO2 22 - 32 mmol/L 25 26 27   Calcium 8.9 - 10.3 mg/dL 7.2(L) 7.1(L) 7.2(L)  Total Protein 6.5 - 8.1 g/dL - - 3.9(L)  Total Bilirubin 0.3 - 1.2 mg/dL - - 1.3(H)  Alkaline Phos 38 - 126 U/L - - 87  AST 15 - 41 U/L - - 16  ALT 0 - 44 U/L - - 19     Assessment/Plan:   Principal Problem:   SBO (small bowel obstruction) (HCC) Active Problems:   Aggressive behavior   Bipolar affective disorder, depressed, mild (HCC)   Hypoalbuminemia   Protein-calorie malnutrition, severe   Crohn's disease of small intestine with intestinal obstruction (HCC)   Patient Summary: Tyler Dominguez is a 39 y.o. male with a pertinent PMH of SBO and bipolar disorder with pyschosis who presented with one week of abdominal  cramping, distension and nausea and was admitted for SBO.   This is hospital day 10.  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 ordered  Resolved New-onset atrial fibrillation with RVR. No further episodes and has remained in sinus rhythm. Suspect episode was attributable to acute illness.  Mood disorder Bipolar disorder with psychosis Aggressive behavior - Patient on monthly aripiprazole (Abilify), last dose 12/28/19   Diet: Soft diet IVF: None VTE: Enoxaparin Code: Full PT/OT recs: SNF   24/7 supervision; patient and family decline, prefer HH services   Dispo: medically stable for discharge with close follow up with general surgery and GI. Will have him follow up in our clinic in 2w after which time he will need to find a PCP.   Please contact the on call pager after 5 pm and on weekends at [phone].  Mitzi Hansen, MD Internal Medicine Resident PGY-2 [facility] Pager: [phone] 01/16/2020 11:33 AM

## 2020-01-17 NOTE — Discharge Summary (Signed)
Name: Tyler Dominguez MRN: [id number] DOB: 1980/04/29 39 y.o. PCP: Patient, No Pcp Per  Date of Admission: 01/06/2020  3:48 PM Date of Discharge: 01/16/2020 Attending Physician: No att. providers found  Discharge Diagnosis: 1. Small bowel obstruction 2. Chron's disease 3. Atrial fibrillation with RVR-resolved 4. Small sliding hiatal hernia 5. Possible hepatic steatosis 6. Prostate enlargement 7. Aortic atherosclerosis  Discharge Medications: Allergies as of 01/16/2020   No Known Allergies     Medication List    TAKE these medications   ABILIFY MAINTENA IM Inject 1 Syringe into the muscle every 28 (twenty-eight) days.   cholecalciferol 25 MCG (1000 UNIT) tablet Commonly known as: VITAMIN D3 Take 1,000 Units by mouth daily.   hydrOXYzine 50 MG capsule Commonly known as: VISTARIL Take 50 mg by mouth 2 (two) times daily as needed for anxiety or itching.   MELATONIN PO Take 1 tablet by mouth at bedtime as needed (for sleep).   traZODone 100 MG tablet Commonly known as: DESYREL Take 200 mg by mouth at bedtime as needed for sleep.            Discharge Care Instructions  (From admission, onward)         Start     Ordered   01/16/20 0000  Discharge wound care:       Comments: Abdominal binder   01/16/20 1119          Disposition and follow-up:   Mr.Mataeo D Kakar was discharged from [facility] in Stable condition.  At the hospital follow up visit please address:  1.  Small bowel obstruction. He underwent exploratory laparotomy on 10/26 at which time biopsy was performed and was positive for chron's disease.  --General surgery nurse visit on 11/9 for staple removal --General surgery follow up visit--date/time pending --Pt did not require pain medications on the days leading up to discharge so did not discharge with any  2. Chron's disease (new diagnosis). As noted on biopsy. No treatment started. --follow up with GI (date/time  pending)  3. Malnutrition due Chron's disease. PT recommended SNF however pt and his mother refused. --Home health ordered at discharge. --folate supplementation --consider referral to dietician  2.  Labs / imaging needed at time of follow-up: none. Immunology labs to be ordered by GI  3.  Pending labs/ test needing follow-up: none  Follow-up Appointments:  Follow-up Information    Donnie Mesa, MD Follow up.   Specialty: General Surgery Why: our office is scheduling you for post-operative follow up. please call to confirm appointment date/time. Contact information: [address] [place] [address] [phone]        [facility], [place]. Go on 01/25/2020.   Specialty: General Surgery Why: our office is scheduling you to have your staples removed by a nurse on 01/25/20. please call to confirm appointment date/time. Contact information: [facility] Follow up in 2 week(s).   Why: Our office will schedule you for a post hospital follow up. Our office should call you with a date and time. If you do not hear from them by Wednesday, please call to confirm appointment. Contact information: [address] [place], [facility] Follow up.   Specialty: Home Health Services Why: [phone].  Physical and occupational therapist will visit from this  agency.              Hospital Course: 61 yom with a history of recurrent SBOs and bipolar disorder who presented to [facility] on 01/06/20 with 1w history of abdominal pain and nausea. He had numerous prior ED visits for SBO however left AMA prior to receiving treatment.  Imaging on admission revealed a small bowel obstruction with evidence of mesenteric scarring. There were also enlarged mesenteric lymph nodes.  Pt's hospitalization was  complicated by refusing NG and surgery initially. He ultimately became amenable to surgery on 10/25 and underwent an uncomplicated exploratory laparotomy on 10/26. Tissue biopsy results were consistent with Chron's disease. He did have a post-operative drop in hgb requiring 2U PRBCs however hgb remained stable after that. His pain was well managed and he did not require pain medications prior to discharge. His diet was advanced and he was tolerating this well at time of discharge.   He had persistent tachycardia throughout his hospitalization. He had 2 brief episodes of atrial fibrillation with RVR however was in sinus tachycardia on discharge.   He did also develop a short period of time when he needed oxygen. Imaging revealed a large pleural effusion and IR was consulted for a thoracentesis however he returned to being on room air prior to the procedure. He had lower extremity swelling as well. Both this and the effusion are thought to potentially be attributable to low albumin.  Discharge Vitals:   BP 113/79 (BP Location: Left Arm)   Pulse 94   Temp 97.9 F (36.6 C) (Oral)   Resp 20   Wt 61.2 kg   SpO2 100%   BMI 19.37 kg/m   Pertinent Labs, Studies, and Procedures:   10/21 CT abdomen/pelvis:  1. Severe small bowel dilatation is noted consistent with obstruction. There is an area of mesenteric scarring and spiculation noted, which was present on prior exam. It is uncertain if this is due to scarring, adhesion or possibly carcinoid tumor. The inferior portion of the cecum also appears to be affected by this mesenteric scarring as well as the terminal ileum. 2. There is continued presence of enlarged mesenteric lymph nodes, the largest measuring 19 mm in minor axis. While these may be inflammatory in etiology, malignancy or metastatic disease cannot be excluded. 3. Small sliding-type hiatal hernia. 4. Possible hepatic steatosis. 5. Stable mild prostatic enlargement. 6. Aortic  atherosclerosis.  10/28 CT chest/abd/pelvis: 1. Moderate volume of free air, not unexpected in a patient 2 days post laparotomy. Small -moderate free fluid in the abdomen and pelvis, measuring simple fluid density. No evidence of organized collection or hemorrhage. 2. Moderate bilateral pleural effusions, left greater than right, with adjacent compressive atelectasis. Additional patchy consolidations involving the right greater than left upper lobes are greater than expected for compressive atelectasis and may be related to aspiration. There is debris within the dependent trachea. 3. Persistent but improved fluid-filled small bowel distension after recent small bowel resection. Suspect an element of postoperative ileus. 4. Layering sludge or gallstones within the gallbladder. 5. Generalized subcutaneous soft tissue edema. More confluent edema in the flanks.  Discharge Instructions: Discharge Instructions    Call MD for:  difficulty breathing, headache or visual disturbances   Complete by: As directed    Call MD for:  extreme fatigue   Complete by: As directed    Call MD for:  persistant dizziness or light-headedness   Complete by: As directed    Call MD for:  persistant nausea and vomiting  Complete by: As directed    Call MD for:  redness, tenderness, or signs of infection (pain, swelling, redness, odor or green/yellow discharge around incision site)   Complete by: As directed    Call MD for:  severe uncontrolled pain   Complete by: As directed    Call MD for:  temperature >100.4   Complete by: As directed    Discharge wound care:   Complete by: As directed    Abdominal binder   Increase activity slowly   Complete by: As directed       Signed: Mitzi Hansen, MD Internal Medicine Resident PGY-2 [facility] Pager: [phone] 01/17/2020 10:15 AM

## 2020-01-18 ENCOUNTER — Telehealth: Payer: Self-pay | Admitting: Internal Medicine

## 2020-01-18 LAB — CULTURE, BLOOD (ROUTINE X 2)
Culture: NO GROWTH
Culture: NO GROWTH
Special Requests: ADEQUATE
Special Requests: ADEQUATE

## 2020-01-18 NOTE — Telephone Encounter (Signed)
TOC HFU APPT [facility] WITH DR [facility] FOR 01/27/2020 10:45AM.

## 2020-01-21 NOTE — Telephone Encounter (Signed)
Transition Care Management Follow-up Telephone Call  Date of discharge and from where: 01/16/20 from [facility]  How have you been since you were released from the hospital? "Good"  Any questions or concerns? No  Items Reviewed:  Did the pt receive and understand the discharge instructions provided? Yes   Medications obtained and verified? Yes   Other? No   Any new allergies since your discharge? No   Dietary orders reviewed? Yes  Do you have support at home? Yes , Mother  [place] and Equipment/Supplies: Were home health services ordered? No  Functional Questionnaire: (I = Independent and D = Dependent) ADLs: I Bathing/Dressing- I Meal Prep-I Eating-I Maintaining continence-I Transferring/Ambulation- I  Managing Meds- I  Follow up appointments reviewed:   PCP Hospital f/u appt confirmed? Yes  Scheduled to see Dr. Sherry Ruffing on 01/27/20 @ 1045.  [facility] f/u appt confirmed? Yes  Scheduled to see [facility] on 03/09/20 @ 1:30.  Are transportation arrangements needed? No   If their condition worsens, is the pt aware to call PCP or go to the Emergency Dept.? Yes  Was the patient provided with contact information for the PCP's office or ED? Yes  Was to pt encouraged to call back with questions or concerns? Yes
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
## 2020-01-27 ENCOUNTER — Ambulatory Visit (INDEPENDENT_AMBULATORY_CARE_PROVIDER_SITE_OTHER): Payer: Medicaid Other | Admitting: Internal Medicine

## 2020-01-27 ENCOUNTER — Other Ambulatory Visit: Payer: Self-pay

## 2020-01-27 ENCOUNTER — Encounter: Payer: Self-pay | Admitting: Internal Medicine

## 2020-01-27 DIAGNOSIS — K50012 Crohn's disease of small intestine with intestinal obstruction: Secondary | ICD-10-CM

## 2020-01-27 NOTE — Patient Instructions (Addendum)
Mr. Tyler Dominguez,  It was a pleasure to see you today. Thank you for coming in.   Today we discussed your recent hospitalization. I am glad that you are feeling better.  Please follow up with the gastroenterologist to discuss further treatment and management.  Please follow up with surgery in regards to your wounds. Please continue following with your primary care provider regarding your potassium and lower extremity swelling.   Thank you again for coming in.   [name].D.

## 2020-01-27 NOTE — Assessment & Plan Note (Signed)
Patient admitted from 10/21-10/31 for small bowel obstruction, Crohn's disease, atrial fibrillation, hiatal hernia, possible hepatic steatosis, prostate enlargement, and aortic atherosclerosis.  Initially presented with abdominal pain and nausea, with multiple ED visits for small bowel obstruction however left AMA multiple times prior to receiving treatment.  During hospitalization patient had 2 brief episodes of A. fib with RVR, this resolved to sinus tachycardia on discharge. Underwent laparotomy on 10/26 with biopsy that was positive for Crohn's disease.  Had follow-up with general surgery on 11/9 for staple removal.  Currently not any medications for this however has follow-up scheduled with the GI on 03/09/2020.  Today, mother and patient reports that he is doing well, denies any abdominal pain, nausea, vomiting, fevers, chills, chest pain, shortness of breath, or constipation.  Reports having approximately 2 bowel movements per day, does state that it is watery.  He has followed up with the general surgery service and had staples removed 2 days ago.  They also report that he has followed up with his PCP Dr. Nolene Ebbs at [facility] 2 days ago, that he has had labs drawn and they discussed his lower extremity swelling started him on Lasix 20 mg daily.  He has follow-up with his PCP in 1 week.  They are aware of his GI appointment in December.  On exam he has a well-healing surgical scar on his abdomen.  Abdominal exam showed normoactive bowel sounds with no abdominal distention, or tenderness.  We discussed that he appears to be doing well from his Crohn's disease and recent hospitalization.  Advised to continue following up with his PCP, GI, and surgery. No changes at this time.

## 2020-01-28 NOTE — Progress Notes (Signed)
Internal Medicine Clinic Attending ° °Case discussed with Dr. Krienke  At the time of the visit.  We reviewed the resident’s history and exam and pertinent patient test results.  I agree with the assessment, diagnosis, and plan of care documented in the resident’s note.  °

## 2020-03-09 ENCOUNTER — Ambulatory Visit: Payer: Medicaid Other | Admitting: Gastroenterology

## 2020-04-14 IMAGING — DX PORTABLE ABDOMEN - 1 VIEW
1 series · 1 of 1 positions shown · non-contrast
Comparison: CT 05/28/2018

CLINICAL DATA: 37-year-old with past medical history significant
for tobacco abuse, remote disorder who presents to the emergency
department complaining of nausea vomiting for 24 hours prior to
admission. CT abdomen showed small bowel obstruct.*comment was
truncated*

EXAM:
PORTABLE ABDOMEN - 1 VIEW

[abdomen]
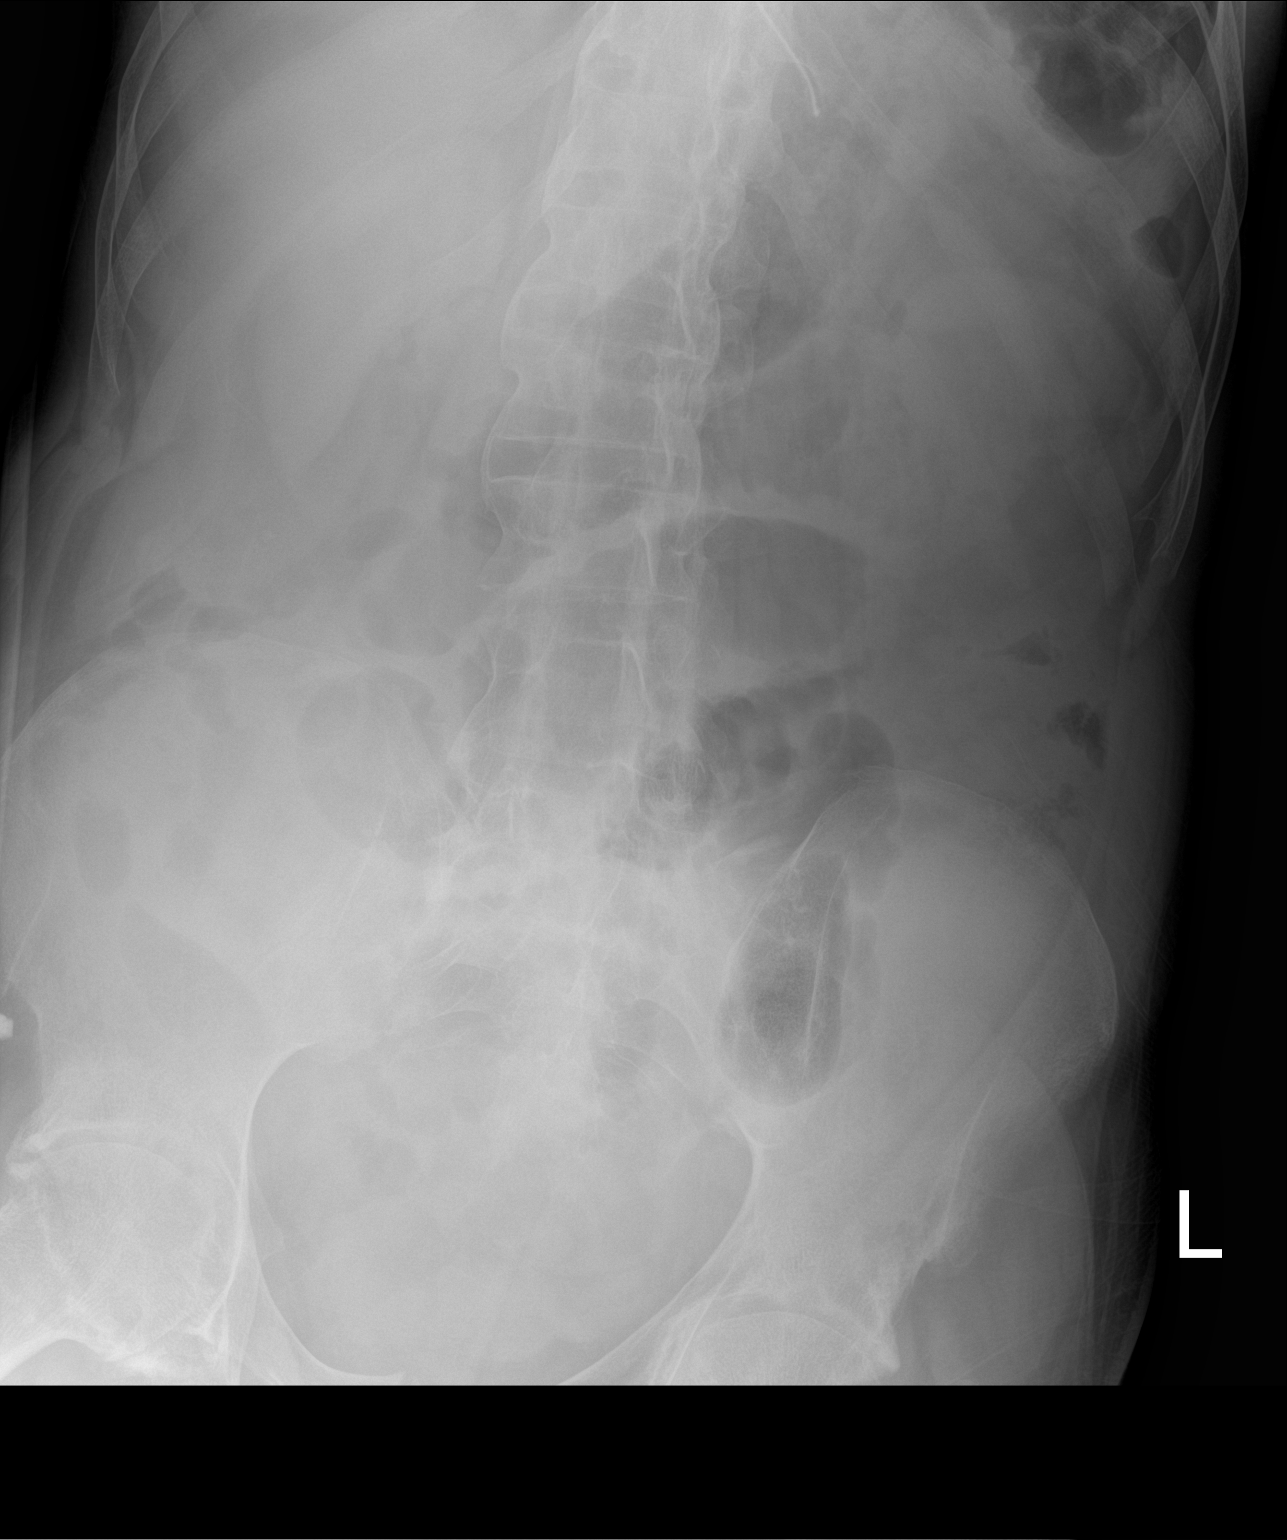

[1 of 1 positions shown; findings below may reference images not displayed]

FINDINGS: Persistent dilated loops of small bowel in the mid abdomen measuring
3.7 cm compared to 4.7 cm on CT topogram. NG tube tip is seen in
the proximal stomach. Small amount gas the rectum.
IMPRESSION: Persistent small bowel obstruction pattern. NG tube with tip in the
stomach. Small volume gas in the rectum.

## 2020-04-15 IMAGING — DX PORTABLE ABDOMEN - 1 VIEW
1 series · 1 of 1 positions shown · non-contrast
Comparison: 05/29/2018 radiograph and 05/28/2018 CT

CLINICAL DATA: Small bowel obstruction follow-up

EXAM:
PORTABLE ABDOMEN - 1 VIEW

[abdomen]
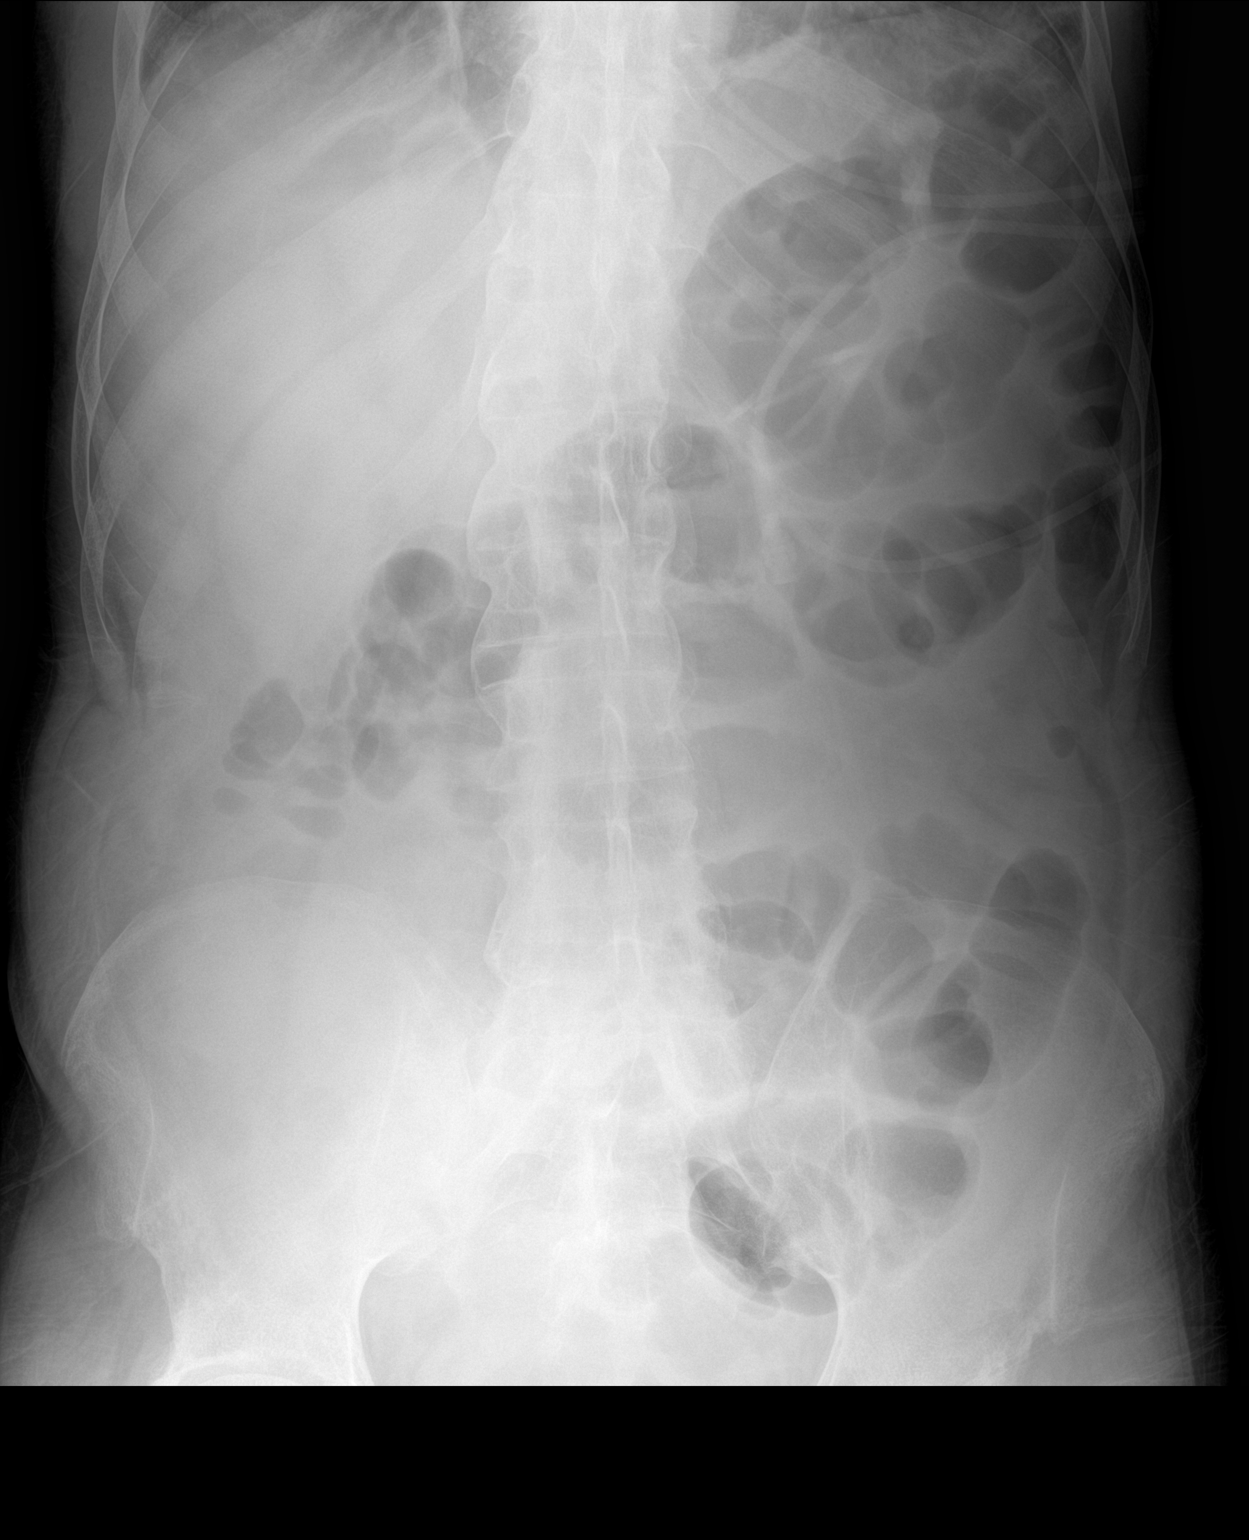

[1 of 1 positions shown; findings below may reference images not displayed]

FINDINGS: Mild gaseous distension of small bowel small bowel has slightly
decreased.

Small amount of colonic gas is again noted.

No other significant change.
IMPRESSION: Slightly decreased mild gaseous distension of small bowel loops.

## 2020-04-24 ENCOUNTER — Ambulatory Visit: Payer: Medicaid Other | Admitting: Gastroenterology

## 2020-06-07 ENCOUNTER — Ambulatory Visit: Payer: Medicaid Other | Admitting: Gastroenterology

## 2020-06-27 ENCOUNTER — Ambulatory Visit: Payer: Medicaid Other | Admitting: Nurse Practitioner

## 2021-03-25 IMAGING — CR DG ABDOMEN ACUTE W/ 1V CHEST
3 series · 3 of 3 positions shown · non-contrast
Comparison: May 31, 2018

CLINICAL DATA: Skull bowel obstruction.

EXAM:
DG ABDOMEN ACUTE W/ 1V CHEST

[w chest pa]
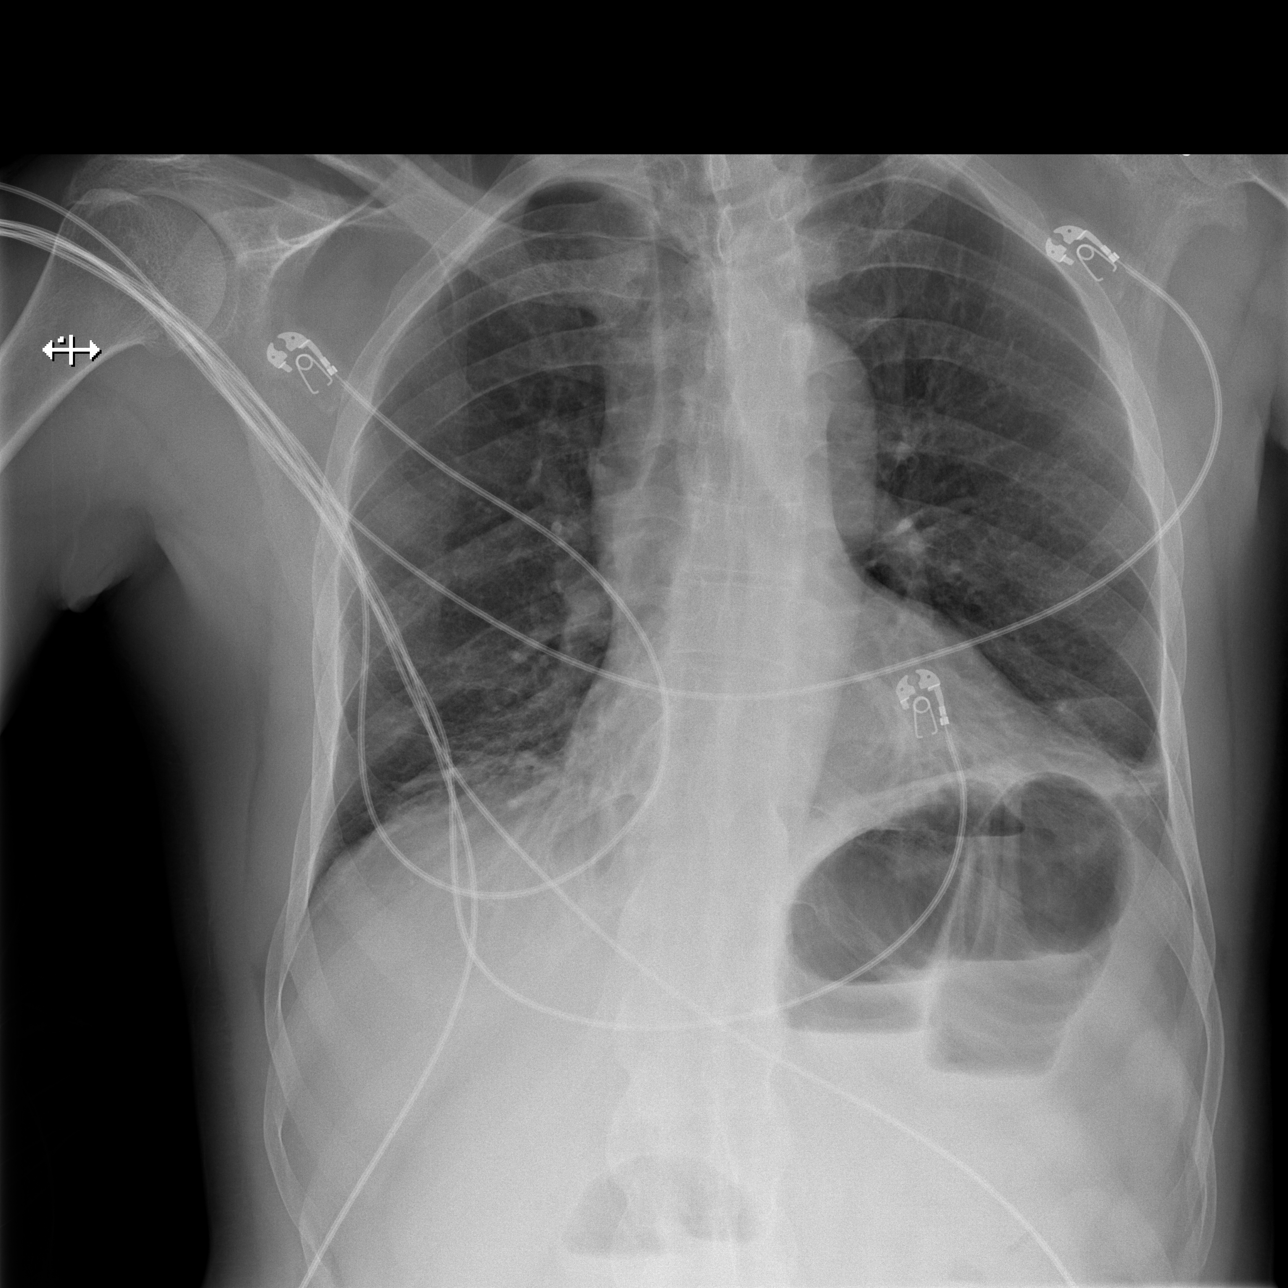

[w abdomen upright]
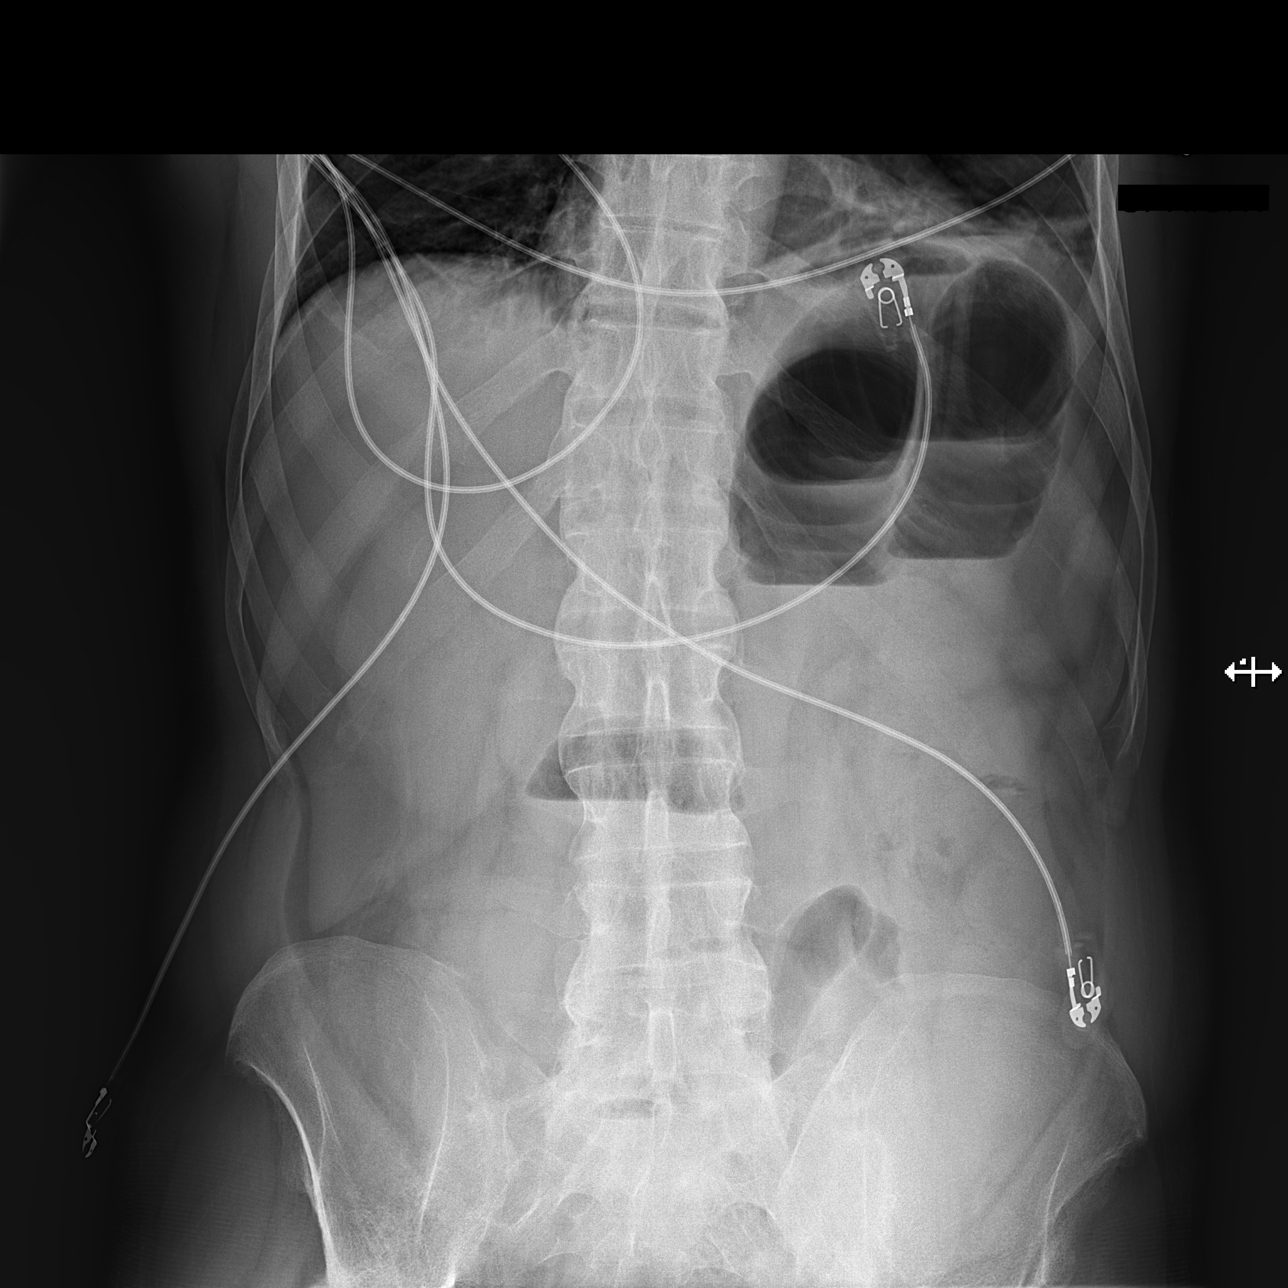

[t abdomen supine]
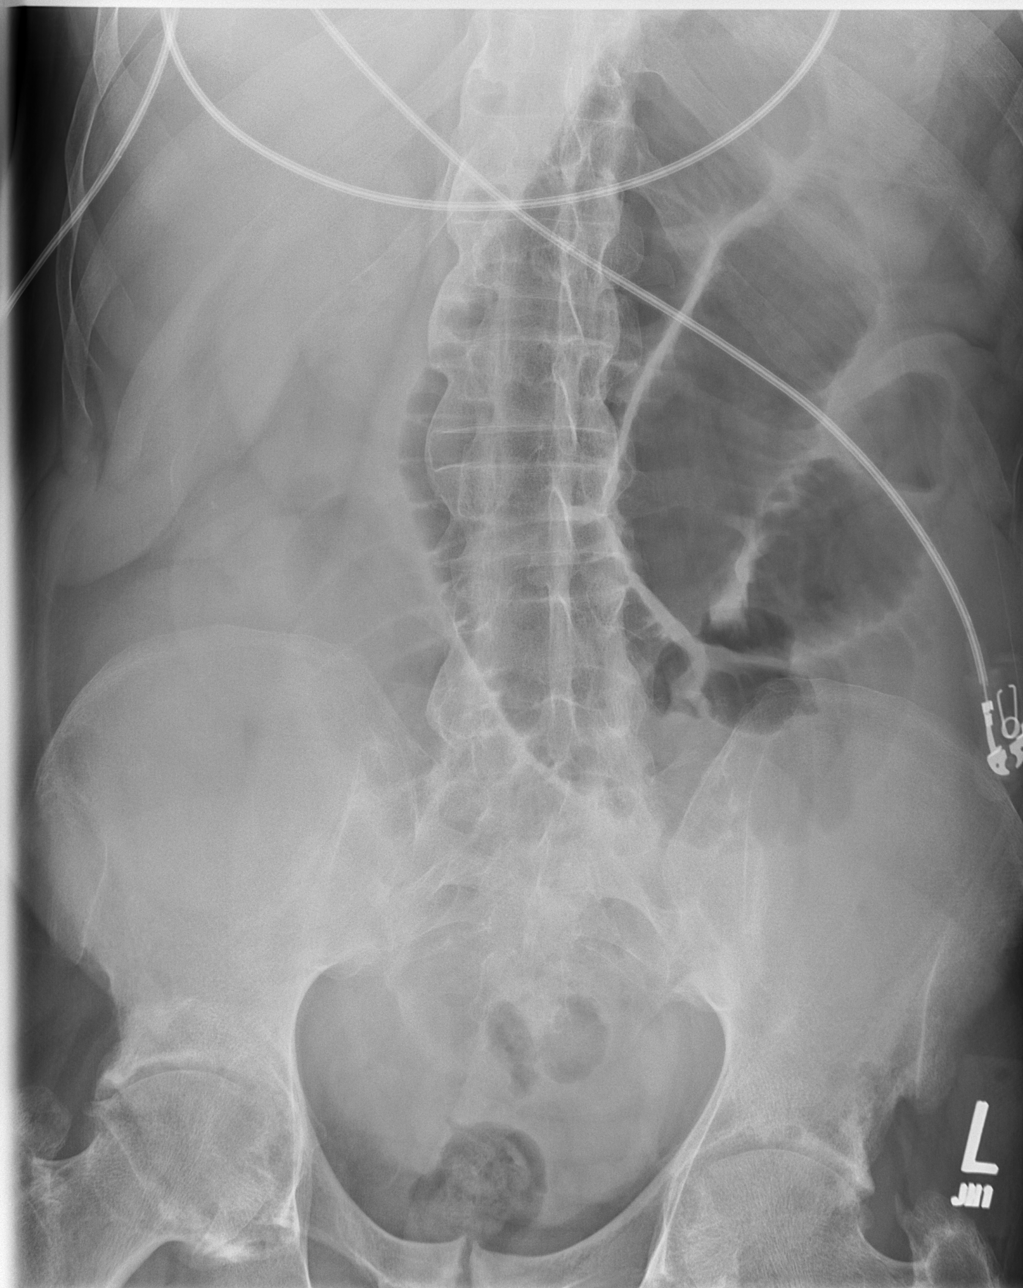

[3 of 3 positions shown; findings below may reference images not displayed]

FINDINGS: There is atelectasis at the lung bases bilaterally. There is no
pneumothorax. No large pleural effusion. The heart size is normal.
There is no acute osseous abnormality. There are dilated loops of
small bowel in the left upper quadrant measuring up to approximately
5.3 cm. There are fluid levels are noted. There is a relative
paucity of bowel gas throughout the remaining portions of the
abdomen. There are advanced degenerative changes of both hips, right
worse than left.
IMPRESSION: 1. Small-bowel obstruction.
2. No acute cardiopulmonary process.
3. Probable atelectasis at the lung bases, left worse than right.

## 2021-06-30 ENCOUNTER — Emergency Department (HOSPITAL_COMMUNITY)
Admission: EM | Admit: 2021-06-30 | Discharge: 2021-06-30 | Disposition: A | Discharge status: Home / Self Care | Payer: Medicaid Other | Attending: Emergency Medicine | Admitting: Emergency Medicine

## 2021-06-30 ENCOUNTER — Emergency Department (HOSPITAL_COMMUNITY): Payer: Medicaid Other

## 2021-06-30 DIAGNOSIS — R569 Unspecified convulsions: Secondary | ICD-10-CM

## 2021-06-30 LAB — COMPREHENSIVE METABOLIC PANEL
ALT: 18 U/L (ref 0–44)
AST: 29 U/L (ref 15–41)
Albumin: 3 g/dL — ABNORMAL LOW (ref 3.5–5.0)
Alkaline Phosphatase: 80 U/L (ref 38–126)
Anion gap: 12 (ref 5–15)
BUN: 7 mg/dL (ref 6–20)
CO2: 18 mmol/L — ABNORMAL LOW (ref 22–32)
Calcium: 8.3 mg/dL — ABNORMAL LOW (ref 8.9–10.3)
Chloride: 107 mmol/L (ref 98–111)
Creatinine, Ser: 0.92 mg/dL (ref 0.61–1.24)
GFR, Estimated: >60 mL/min (ref >60)
Glucose, Bld: 106 mg/dL — ABNORMAL HIGH (ref 70–99)
Potassium: 4 mmol/L (ref 3.5–5.1)
Sodium: 137 mmol/L (ref 135–145)
Total Bilirubin: 0.6 mg/dL (ref 0.3–1.2)
Total Protein: 6.7 g/dL (ref 6.5–8.1)

## 2021-06-30 LAB — CBC WITH DIFFERENTIAL/PLATELET
Abs Immature Granulocytes: 0.05 10*3/uL (ref 0.00–0.07)
Basophils Absolute: 0 10*3/uL (ref 0.0–0.1)
Basophils Relative: 0 %
Eosinophils Absolute: 0.1 10*3/uL (ref 0.0–0.5)
Eosinophils Relative: 2 %
HCT: 38.5 % — ABNORMAL LOW (ref 39.0–52.0)
Hemoglobin: 12.2 g/dL — ABNORMAL LOW (ref 13.0–17.0)
Immature Granulocytes: 1 %
Lymphocytes Relative: 27 %
Lymphs Abs: 1.8 10*3/uL (ref 0.7–4.0)
MCH: 25.8 pg — ABNORMAL LOW (ref 26.0–34.0)
MCHC: 31.7 g/dL (ref 30.0–36.0)
MCV: 81.6 fL (ref 80.0–100.0)
Monocytes Absolute: 0.2 10*3/uL (ref 0.1–1.0)
Monocytes Relative: 3 %
Neutro Abs: 4.6 10*3/uL (ref 1.7–7.7)
Neutrophils Relative %: 67 %
Platelets: 337 10*3/uL (ref 150–400)
RBC: 4.72 MIL/uL (ref 4.22–5.81)
RDW: 16.4 % — ABNORMAL HIGH (ref 11.5–15.5)
WBC: 6.8 10*3/uL (ref 4.0–10.5)
nRBC: 0 % (ref 0.0–0.2)

## 2021-06-30 LAB — I-STAT VENOUS BLOOD GAS, ED
Acid-base deficit: 4 mmol/L — ABNORMAL HIGH (ref 0.0–2.0)
Bicarbonate: 21.3 mmol/L (ref 20.0–28.0)
Calcium, Ion: 1.03 mmol/L — ABNORMAL LOW (ref 1.15–1.40)
HCT: 38 % — ABNORMAL LOW (ref 39.0–52.0)
Hemoglobin: 12.9 g/dL — ABNORMAL LOW (ref 13.0–17.0)
O2 Saturation: 84 %
Potassium: 4 mmol/L (ref 3.5–5.1)
Sodium: 138 mmol/L (ref 135–145)
TCO2: 22 mmol/L (ref 22–32)
pCO2, Ven: 37.3 mmHg — ABNORMAL LOW (ref 44–60)
pH, Ven: 7.365 (ref 7.25–7.43)
pO2, Ven: 51 mmHg — ABNORMAL HIGH (ref 32–45)

## 2021-06-30 LAB — URINALYSIS, ROUTINE W REFLEX MICROSCOPIC
Bacteria, UA: NONE SEEN
Bilirubin Urine: NEGATIVE
Glucose, UA: NEGATIVE mg/dL
Ketones, ur: NEGATIVE mg/dL
Nitrite: NEGATIVE
Protein, ur: 30 mg/dL — AB
RBC / HPF: >50 RBC/hpf — ABNORMAL HIGH (ref 0–5)
Specific Gravity, Urine: 1.013 (ref 1.005–1.030)
pH: 5 (ref 5.0–8.0)

## 2021-06-30 LAB — ETHANOL: Alcohol, Ethyl (B): <10 mg/dL (ref <10)

## 2021-06-30 LAB — RAPID URINE DRUG SCREEN, HOSP PERFORMED
Amphetamines: NOT DETECTED
Barbiturates: NOT DETECTED
Benzodiazepines: NOT DETECTED
Cocaine: NOT DETECTED
Opiates: NOT DETECTED
Tetrahydrocannabinol: POSITIVE — AB

## 2021-06-30 LAB — LACTIC ACID, PLASMA: Lactic Acid, Venous: 6.3 mmol/L (ref 0.5–1.9)

## 2021-06-30 LAB — AMMONIA: Ammonia: 75 umol/L — ABNORMAL HIGH (ref 9–35)

## 2021-06-30 LAB — CBG MONITORING, ED: Glucose-Capillary: 123 mg/dL — ABNORMAL HIGH (ref 70–99)

## 2021-06-30 MED ORDER — LEVETIRACETAM 500 MG PO TABS: 500.0000 mg | ORAL_TABLET | Freq: Two times a day (BID) | ORAL | 0 refills | Status: DC

## 2021-06-30 MED ORDER — LEVETIRACETAM IN NACL 1000 MG/100ML IV SOLN
1000.0000 mg | Freq: Once | INTRAVENOUS | Status: AC
  Administered 2021-06-30: 1000 mg via INTRAVENOUS
  Filled 2021-06-30: qty 100

## 2021-06-30 MED ORDER — VALTOCO 15 MG DOSE 7.5 MG/0.1ML NA LQPK: 15.0000 mg | Freq: Once | NASAL | 0 refills | Status: AC | PRN

## 2021-06-30 MED ORDER — SODIUM CHLORIDE 0.9 % IV BOLUS
1000.0000 mL | Freq: Once | INTRAVENOUS | Status: AC
  Administered 2021-06-30: 1000 mL via INTRAVENOUS

## 2021-06-30 NOTE — ED Provider Notes (Signed)
?[facility] ?Provider Note ? ? ?CSN: [id number] ?Arrival date & time: 06/30/21  5035 ? ?  ? ?History ? ?Chief Complaint  ?Patient presents with  ? Seizures  ? ? ?Tyler Dominguez is a 41 y.o. male. ? ?41 yo M with a cc of seizure like activity.  Patient was found after a thump in the room, noted to be shaking.  Less responsive afterwards.  Loss of bladder.  No recent illness, no recent medication change per mom.  ?Remote hx of seizures post head injury.  Not on medication.  Has been having grunting episodes that resolve without shaking but not able to break them with verbal or physical contact.  ? ?The history is provided by the patient, the EMS personnel and a parent.  ?Seizures ? ?  ? ?Home Medications ?Prior to Admission medications   ?Medication Sig Start Date End Date Taking? Authorizing Provider  ?ARIPiprazole (ABILIFY MAINTENA IM) Inject 1 Syringe into the muscle every 28 (twenty-eight) days. Mom did not know the dose given monthly.   Yes [provider]  ?cholecalciferol (VITAMIN D3) 25 MCG (1000 UNIT) tablet Take 1,000 Units by mouth once a week.   Yes [provider]  ?diazePAM, 15 MG Dose, (VALTOCO 15 MG DOSE) 2 x 7.5 MG/0.1ML LQPK Place 15 mg into the nose once as needed for up to 1 dose. 7.5 ml into each naris for seizure lasting >5 min 06/30/21  Yes Deno Etienne, DO  ?Multiple Vitamins-Minerals (EQ MULTIVITAMINS ADULT GUMMY) CHEW Chew 1 tablet by mouth 2 (two) times a week.   Yes [provider]  ?levETIRAcetam (KEPPRA) 500 MG tablet Take 1 tablet (500 mg total) by mouth 2 (two) times daily. 06/30/21   Deno Etienne, DO  ?   ? ?Allergies    ?Patient has no known allergies.   ? ?Review of Systems   ?Review of Systems  ?Neurological:  Positive for seizures.  ? ?Physical Exam ?Updated Vital Signs ?BP (!) 131/94   Pulse 74   Temp 98.8 ?F (37.1 ?C)   Resp 15   SpO2 97%  ?Physical Exam ?Vitals and nursing note reviewed.  ?Constitutional:   ?    Appearance: He is well-developed.  ?HENT:  ?   Head: Normocephalic and atraumatic.  ?Eyes:  ?   Pupils: Pupils are equal, round, and reactive to light.  ?Neck:  ?   Vascular: No JVD.  ?Cardiovascular:  ?   Rate and Rhythm: Normal rate and regular rhythm.  ?   Heart sounds: No murmur heard. ?  No friction rub. No gallop.  ?Pulmonary:  ?   Effort: No respiratory distress.  ?   Breath sounds: No wheezing.  ?Abdominal:  ?   General: There is no distension.  ?   Tenderness: There is no abdominal tenderness. There is no guarding or rebound.  ?Musculoskeletal:     ?   General: Normal range of motion.  ?   Cervical back: Normal range of motion and neck supple.  ?Skin: ?   Coloration: Skin is not pale.  ?   Findings: No rash.  ?Neurological:  ?   Mental Status: He is alert.  ?   Comments: Non verbal, moving all 4 extremities spontaneously.   ?Psychiatric:     ?   Behavior: Behavior normal.  ? ? ?ED Results / Procedures / Treatments   ?Labs ?(all labs ordered are listed, but only abnormal results are displayed) ?Labs Reviewed  ?COMPREHENSIVE METABOLIC  PANEL - Abnormal; Notable for the following components:  ?    Result Value  ? CO2 18 (*)   ? Glucose, Bld 106 (*)   ? Calcium 8.3 (*)   ? Albumin 3.0 (*)   ? All other components within normal limits  ?LACTIC ACID, PLASMA - Abnormal; Notable for the following components:  ? Lactic Acid, Venous 6.3 (*)   ? All other components within normal limits  ?CBC WITH DIFFERENTIAL/PLATELET - Abnormal; Notable for the following components:  ? Hemoglobin 12.2 (*)   ? HCT 38.5 (*)   ? MCH 25.8 (*)   ? RDW 16.4 (*)   ? All other components within normal limits  ?URINALYSIS, ROUTINE W REFLEX MICROSCOPIC - Abnormal; Notable for the following components:  ? APPearance CLOUDY (*)   ? Hgb urine dipstick LARGE (*)   ? Protein, ur 30 (*)   ? Leukocytes,Ua SMALL (*)   ? RBC / HPF >50 (*)   ? Non Squamous Epithelial 0-5 (*)   ? All other components within normal limits  ?RAPID URINE DRUG SCREEN, HOSP  PERFORMED - Abnormal; Notable for the following components:  ? Tetrahydrocannabinol POSITIVE (*)   ? All other components within normal limits  ?AMMONIA - Abnormal; Notable for the following components:  ? Ammonia 75 (*)   ? All other components within normal limits  ?CBG MONITORING, ED - Abnormal; Notable for the following components:  ? Glucose-Capillary 123 (*)   ? All other components within normal limits  ?I-STAT VENOUS BLOOD GAS, ED - Abnormal; Notable for the following components:  ? pCO2, Ven 37.3 (*)   ? pO2, Ven 51 (*)   ? Acid-base deficit 4.0 (*)   ? Calcium, Ion 1.03 (*)   ? HCT 38.0 (*)   ? Hemoglobin 12.9 (*)   ? All other components within normal limits  ?ETHANOL  ? ? ?EKG ?EKG Interpretation ? ?Date/Time:  Saturday June 30 2021 08:23:07 EDT ?Ventricular Rate:  87 ?PR Interval:  145 ?QRS Duration: 104 ?QT Interval:  352 ?QTC Calculation: 424 ?R Axis:   64 ?Text Interpretation: Sinus rhythm RSR' in V1 or V2, probably normal variant Left ventricular hypertrophy Nonspecific T abnrm, anterolateral leads No significant change since last tracing Confirmed by Deno Etienne [phone]) on 06/30/2021 10:41:46 AM ? ?Radiology ?CT HEAD WO CONTRAST ? ?Result Date: 06/30/2021 ?CLINICAL DATA:  Status post fall, altered mental status. EXAM: CT HEAD WITHOUT CONTRAST TECHNIQUE: Contiguous axial images were obtained from the base of the skull through the vertex without intravenous contrast. RADIATION DOSE REDUCTION: This exam was performed according to the departmental dose-optimization program which includes automated exposure control, adjustment of the mA and/or kV according to patient size and/or use of iterative reconstruction technique. COMPARISON:  January 13, 2020 FINDINGS: Brain: No evidence of acute infarction, hemorrhage, hydrocephalus, extra-axial collection or mass lesion/mass effect. Vascular: No hyperdense vessel is noted. Skull: Normal. Negative for fracture or focal lesion. Sinuses/Orbits: No acute finding.  Other: None. IMPRESSION: No CT evidence of acute intracranial abnormality. Electronically Signed   By: Abelardo Diesel M.D.   On: 06/30/2021 09:31  ? ?DG Chest Port 1 View ? ?Result Date: 06/30/2021 ?CLINICAL DATA:  Status post fall. EXAM: PORTABLE CHEST 1 VIEW COMPARISON:  January 14, 2020 FINDINGS: The heart size and mediastinal contours are within normal limits. Both lungs are clear. Scoliosis of spine is noted. The visualized skeletal structures are otherwise unremarkable. IMPRESSION: No active cardiopulmonary disease. Electronically Signed   By: Seward Meth  Augustin Coupe M.D.   On: 06/30/2021 09:25   ? ?Procedures ?Procedures  ? ? ?Medications Ordered in ED ?Medications  ?sodium chloride 0.9 % bolus 1,000 mL (0 mLs Intravenous Stopped 06/30/21 1107)  ?levETIRAcetam (KEPPRA) IVPB 1000 mg/100 mL premix (0 mg Intravenous Stopped 06/30/21 1105)  ? ? ?ED Course/ Medical Decision Making/ A&P ?  ?                        ?Medical Decision Making ?Amount and/or Complexity of Data Reviewed ?Labs: ordered. ?Radiology: ordered. ? ?Risk ?Prescription drug management. ? ? ?41 yo M with a cc of seizure like activity.  Non verbal on arrival.  Will obtain ct head, blood work. ? ?Mom arrived with further hx.  Has been having grunting episodes for year with no shaking.  No recent med changes, no recent illness. ? ?Blood work is resulted and is unremarkable no significant anemia no significant electrolyte abnormality.  CT scan of the head without acute finding.  Patient is now back to his baseline. ? ?I discussed this with neurology on-call, Dr.Khaliqdina, recommends loading with Keppra here and then 500 twice daily outpatient neurology follow-up.  With a reported prolonged seizure events recommended abortive therapy prescription as well. ? ?11:54 AM:  I have discussed the diagnosis/risks/treatment options with the patient and family.  Evaluation and diagnostic testing in the emergency department does not suggest an emergent condition requiring  admission or immediate intervention beyond what has been performed at this time.  They will follow up with  Neuro. We also discussed returning to the ED immediately if new or worsening sx occur. We discussed the
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
## 2021-06-30 NOTE — ED Notes (Signed)
Patient transported to CT 

## 2021-06-30 NOTE — Discharge Instructions (Signed)
A neurologist should call you to try and set up an appointment in the office.  Please return for repeat events.  As we discussed in the room no driving climbing tall heights bathing or swimming by yourself. ?

## 2021-07-31 ENCOUNTER — Ambulatory Visit: Payer: Medicaid Other | Admitting: Neurology

## 2021-09-04 ENCOUNTER — Other Ambulatory Visit: Payer: Self-pay | Admitting: Internal Medicine

## 2021-09-05 LAB — COMPLETE METABOLIC PANEL WITH GFR
AG Ratio: 1.1 (calc) (ref 1.0–2.5)
ALT: 10 U/L (ref 9–46)
AST: 12 U/L (ref 10–40)
Albumin: 3.7 g/dL (ref 3.6–5.1)
Alkaline phosphatase (APISO): 107 U/L (ref 36–130)
BUN: 9 mg/dL (ref 7–25)
CO2: 26 mmol/L (ref 20–32)
Calcium: 8.8 mg/dL (ref 8.6–10.3)
Chloride: 102 mmol/L (ref 98–110)
Creat: 0.85 mg/dL (ref 0.60–1.29)
Globulin: 3.4 g/dL (calc) (ref 1.9–3.7)
Glucose, Bld: 87 mg/dL (ref 65–99)
Potassium: 3.4 mmol/L — ABNORMAL LOW (ref 3.5–5.3)
Sodium: 139 mmol/L (ref 135–146)
Total Bilirubin: 0.7 mg/dL (ref 0.2–1.2)
Total Protein: 7.1 g/dL (ref 6.1–8.1)
eGFR: 113 mL/min/{1.73_m2} (ref >=60)

## 2021-09-05 LAB — LIPID PANEL
Cholesterol: 141 mg/dL (ref <200)
HDL: 58 mg/dL (ref >=40)
LDL Cholesterol (Calc): 67 mg/dL (calc)
Non-HDL Cholesterol (Calc): 83 mg/dL (calc) (ref <130)
Total CHOL/HDL Ratio: 2.4 (calc) (ref <5.0)
Triglycerides: 82 mg/dL (ref <150)

## 2021-09-05 LAB — CBC
HCT: 37.1 % — ABNORMAL LOW (ref 38.5–50.0)
Hemoglobin: 11.8 g/dL — ABNORMAL LOW (ref 13.2–17.1)
MCH: 24.3 pg — ABNORMAL LOW (ref 27.0–33.0)
MCHC: 31.8 g/dL — ABNORMAL LOW (ref 32.0–36.0)
MCV: 76.5 fL — ABNORMAL LOW (ref 80.0–100.0)
MPV: 8.9 fL (ref 7.5–12.5)
Platelets: 380 10*3/uL (ref 140–400)
RBC: 4.85 10*6/uL (ref 4.20–5.80)
RDW: 16 % — ABNORMAL HIGH (ref 11.0–15.0)
WBC: 6.3 10*3/uL (ref 3.8–10.8)

## 2021-09-05 LAB — TSH: TSH: 0.89 mIU/L (ref 0.40–4.50)

## 2021-09-05 LAB — VITAMIN D 25 HYDROXY (VIT D DEFICIENCY, FRACTURES): Vit D, 25-Hydroxy: 17 ng/mL — ABNORMAL LOW (ref 30–100)

## 2021-09-05 LAB — PSA: PSA: 0.57 ng/mL (ref <=4.00)

## 2022-04-23 ENCOUNTER — Ambulatory Visit: Payer: Medicaid Other | Attending: Internal Medicine | Admitting: Physical Therapy

## 2022-04-23 ENCOUNTER — Encounter: Payer: Self-pay | Admitting: Physical Therapy

## 2022-04-23 VITALS — BP 112/72 | HR 82

## 2022-04-23 DIAGNOSIS — M25561 Pain in right knee: Secondary | ICD-10-CM | POA: Insufficient documentation

## 2022-04-23 DIAGNOSIS — M6281 Muscle weakness (generalized): Secondary | ICD-10-CM | POA: Diagnosis present

## 2022-04-23 DIAGNOSIS — R41840 Attention and concentration deficit: Secondary | ICD-10-CM | POA: Insufficient documentation

## 2022-04-23 DIAGNOSIS — R269 Unspecified abnormalities of gait and mobility: Secondary | ICD-10-CM | POA: Diagnosis present

## 2022-04-23 DIAGNOSIS — M25612 Stiffness of left shoulder, not elsewhere classified: Secondary | ICD-10-CM | POA: Insufficient documentation

## 2022-04-23 DIAGNOSIS — M25622 Stiffness of left elbow, not elsewhere classified: Secondary | ICD-10-CM | POA: Insufficient documentation

## 2022-04-23 DIAGNOSIS — R41844 Frontal lobe and executive function deficit: Secondary | ICD-10-CM | POA: Diagnosis present

## 2022-04-23 DIAGNOSIS — R2681 Unsteadiness on feet: Secondary | ICD-10-CM | POA: Diagnosis present

## 2022-04-23 DIAGNOSIS — R278 Other lack of coordination: Secondary | ICD-10-CM | POA: Diagnosis present

## 2022-04-23 DIAGNOSIS — M25562 Pain in left knee: Secondary | ICD-10-CM | POA: Insufficient documentation
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
## 2022-05-02 ENCOUNTER — Encounter: Payer: Self-pay | Admitting: Occupational Therapy

## 2022-05-02 ENCOUNTER — Ambulatory Visit: Payer: Medicaid Other | Admitting: Occupational Therapy

## 2022-05-02 DIAGNOSIS — R41844 Frontal lobe and executive function deficit: Secondary | ICD-10-CM

## 2022-05-02 DIAGNOSIS — R269 Unspecified abnormalities of gait and mobility: Secondary | ICD-10-CM | POA: Diagnosis not present

## 2022-05-02 DIAGNOSIS — M25612 Stiffness of left shoulder, not elsewhere classified: Secondary | ICD-10-CM

## 2022-05-02 DIAGNOSIS — M6281 Muscle weakness (generalized): Secondary | ICD-10-CM

## 2022-05-02 DIAGNOSIS — R278 Other lack of coordination: Secondary | ICD-10-CM

## 2022-05-02 DIAGNOSIS — R2681 Unsteadiness on feet: Secondary | ICD-10-CM

## 2022-05-02 DIAGNOSIS — R41840 Attention and concentration deficit: Secondary | ICD-10-CM

## 2022-05-02 DIAGNOSIS — M25622 Stiffness of left elbow, not elsewhere classified: Secondary | ICD-10-CM

## 2022-05-07 ENCOUNTER — Ambulatory Visit: Payer: Medicaid Other | Admitting: Physical Therapy

## 2022-05-07 ENCOUNTER — Encounter: Payer: Self-pay | Admitting: Physical Therapy

## 2022-05-07 VITALS — BP 110/77 | HR 82

## 2022-05-07 DIAGNOSIS — R269 Unspecified abnormalities of gait and mobility: Secondary | ICD-10-CM

## 2022-05-07 DIAGNOSIS — M25562 Pain in left knee: Secondary | ICD-10-CM

## 2022-05-07 DIAGNOSIS — M6281 Muscle weakness (generalized): Secondary | ICD-10-CM

## 2022-05-07 DIAGNOSIS — R2681 Unsteadiness on feet: Secondary | ICD-10-CM

## 2022-05-07 DIAGNOSIS — M25561 Pain in right knee: Secondary | ICD-10-CM
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
## 2022-05-16 ENCOUNTER — Ambulatory Visit: Payer: Medicaid Other | Admitting: Physical Therapy

## 2022-05-21 ENCOUNTER — Ambulatory Visit: Payer: Medicaid Other | Attending: Internal Medicine | Admitting: Physical Therapy

## 2022-05-21 ENCOUNTER — Encounter: Payer: Self-pay | Admitting: Physical Therapy

## 2022-05-21 ENCOUNTER — Ambulatory Visit: Payer: Medicaid Other | Admitting: Occupational Therapy

## 2022-05-21 VITALS — BP 104/71 | HR 89

## 2022-05-21 DIAGNOSIS — R41844 Frontal lobe and executive function deficit: Secondary | ICD-10-CM | POA: Diagnosis present

## 2022-05-21 DIAGNOSIS — M25562 Pain in left knee: Secondary | ICD-10-CM | POA: Insufficient documentation

## 2022-05-21 DIAGNOSIS — R269 Unspecified abnormalities of gait and mobility: Secondary | ICD-10-CM | POA: Diagnosis present

## 2022-05-21 DIAGNOSIS — R2681 Unsteadiness on feet: Secondary | ICD-10-CM | POA: Diagnosis present

## 2022-05-21 DIAGNOSIS — M25561 Pain in right knee: Secondary | ICD-10-CM | POA: Insufficient documentation

## 2022-05-21 DIAGNOSIS — M25612 Stiffness of left shoulder, not elsewhere classified: Secondary | ICD-10-CM | POA: Insufficient documentation

## 2022-05-21 DIAGNOSIS — R278 Other lack of coordination: Secondary | ICD-10-CM | POA: Insufficient documentation

## 2022-05-21 DIAGNOSIS — R41840 Attention and concentration deficit: Secondary | ICD-10-CM | POA: Diagnosis present

## 2022-05-21 DIAGNOSIS — M6281 Muscle weakness (generalized): Secondary | ICD-10-CM | POA: Insufficient documentation

## 2022-05-21 DIAGNOSIS — M25622 Stiffness of left elbow, not elsewhere classified: Secondary | ICD-10-CM | POA: Insufficient documentation
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
## 2022-05-30 ENCOUNTER — Ambulatory Visit: Payer: Medicaid Other | Admitting: Occupational Therapy

## 2022-05-30 ENCOUNTER — Ambulatory Visit: Payer: Medicaid Other | Admitting: Physical Therapy

## 2022-06-04 ENCOUNTER — Encounter: Payer: Self-pay | Admitting: Physical Therapy

## 2022-06-04 ENCOUNTER — Ambulatory Visit: Payer: Medicaid Other | Admitting: Physical Therapy

## 2022-06-04 ENCOUNTER — Ambulatory Visit: Payer: Medicaid Other | Admitting: Occupational Therapy

## 2022-06-04 ENCOUNTER — Encounter: Payer: Self-pay | Admitting: Occupational Therapy

## 2022-06-04 VITALS — BP 102/75 | HR 79

## 2022-06-04 DIAGNOSIS — M6281 Muscle weakness (generalized): Secondary | ICD-10-CM

## 2022-06-04 DIAGNOSIS — M25622 Stiffness of left elbow, not elsewhere classified: Secondary | ICD-10-CM

## 2022-06-04 DIAGNOSIS — R2681 Unsteadiness on feet: Secondary | ICD-10-CM

## 2022-06-04 DIAGNOSIS — R41844 Frontal lobe and executive function deficit: Secondary | ICD-10-CM

## 2022-06-04 DIAGNOSIS — R41840 Attention and concentration deficit: Secondary | ICD-10-CM

## 2022-06-04 DIAGNOSIS — R269 Unspecified abnormalities of gait and mobility: Secondary | ICD-10-CM

## 2022-06-04 DIAGNOSIS — R278 Other lack of coordination: Secondary | ICD-10-CM

## 2022-06-04 DIAGNOSIS — M25612 Stiffness of left shoulder, not elsewhere classified: Secondary | ICD-10-CM

## 2022-06-04 DIAGNOSIS — M25561 Pain in right knee: Secondary | ICD-10-CM
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
## 2022-06-06 ENCOUNTER — Ambulatory Visit: Payer: Medicaid Other | Admitting: Neurology

## 2022-06-06 ENCOUNTER — Encounter: Payer: Self-pay | Admitting: Neurology

## 2022-06-07 NOTE — Therapy (Signed)
Pt left following PT appointment and did not return for his OT appointment. Unable to be reached by phone. Visit unable to be completed.

## 2022-06-13 ENCOUNTER — Ambulatory Visit: Payer: Medicaid Other | Admitting: Occupational Therapy

## 2022-06-13 ENCOUNTER — Encounter: Payer: Self-pay | Admitting: Occupational Therapy

## 2022-06-13 ENCOUNTER — Ambulatory Visit: Payer: Medicaid Other | Admitting: Physical Therapy

## 2022-06-13 NOTE — Therapy (Signed)
OCCUPATIONAL THERAPY DISCHARGE SUMMARY  Visits from Start of Care: 1- eval only  Pt has been a no show 2x; left without being seen 1x, and cancelled less than 24 hours in advance 1x. OT discharging at this time per attendance policy. Left voicemail on pt's phone notifying him of the d/c. Will need new therapy order if additional services needed.

## 2022-06-18 ENCOUNTER — Ambulatory Visit: Payer: Medicaid Other | Admitting: Occupational Therapy

## 2022-06-18 ENCOUNTER — Ambulatory Visit: Payer: Medicaid Other | Admitting: Physical Therapy

## 2022-06-27 ENCOUNTER — Encounter: Payer: Medicaid Other | Admitting: Occupational Therapy

## 2022-06-27 ENCOUNTER — Ambulatory Visit: Payer: Medicaid Other | Admitting: Physical Therapy

## 2022-10-17 ENCOUNTER — Other Ambulatory Visit: Payer: Self-pay | Admitting: Internal Medicine

## 2023-02-11 ENCOUNTER — Emergency Department (HOSPITAL_COMMUNITY)
Admission: EM | Admit: 2023-02-11 | Discharge: 2023-02-11 | Disposition: A | Discharge status: Home / Self Care | Payer: MEDICAID | Attending: Emergency Medicine | Admitting: Emergency Medicine

## 2023-02-11 ENCOUNTER — Other Ambulatory Visit: Payer: Self-pay

## 2023-02-11 DIAGNOSIS — R569 Unspecified convulsions: Secondary | ICD-10-CM | POA: Insufficient documentation

## 2023-02-11 DIAGNOSIS — R000 Tachycardia, unspecified: Secondary | ICD-10-CM | POA: Diagnosis not present

## 2023-02-11 LAB — BASIC METABOLIC PANEL
Anion gap: 19 — ABNORMAL HIGH (ref 5–15)
BUN: 12 mg/dL (ref 6–20)
CO2: 14 mmol/L — ABNORMAL LOW (ref 22–32)
Calcium: 8.3 mg/dL — ABNORMAL LOW (ref 8.9–10.3)
Chloride: 103 mmol/L (ref 98–111)
Creatinine, Ser: 0.99 mg/dL (ref 0.61–1.24)
GFR, Estimated: >60 mL/min (ref >60)
Glucose, Bld: 98 mg/dL (ref 70–99)
Potassium: 3 mmol/L — ABNORMAL LOW (ref 3.5–5.1)
Sodium: 136 mmol/L (ref 135–145)

## 2023-02-11 LAB — CBC
HCT: 33.1 % — ABNORMAL LOW (ref 39.0–52.0)
Hemoglobin: 9.8 g/dL — ABNORMAL LOW (ref 13.0–17.0)
MCH: 23.1 pg — ABNORMAL LOW (ref 26.0–34.0)
MCHC: 29.6 g/dL — ABNORMAL LOW (ref 30.0–36.0)
MCV: 77.9 fL — ABNORMAL LOW (ref 80.0–100.0)
Platelets: 341 10*3/uL (ref 150–400)
RBC: 4.25 MIL/uL (ref 4.22–5.81)
RDW: 19 % — ABNORMAL HIGH (ref 11.5–15.5)
WBC: 7.9 10*3/uL (ref 4.0–10.5)
nRBC: 0 % (ref 0.0–0.2)

## 2023-02-11 LAB — CBG MONITORING, ED: Glucose-Capillary: 100 mg/dL — ABNORMAL HIGH (ref 70–99)

## 2023-02-11 MED ORDER — LEVETIRACETAM 500 MG PO TABS: 500.0000 mg | ORAL_TABLET | Freq: Two times a day (BID) | ORAL | 1 refills | Status: AC

## 2023-02-11 MED ORDER — LEVETIRACETAM IN NACL 1500 MG/100ML IV SOLN
1500.0000 mg | Freq: Once | INTRAVENOUS | Status: AC
  Administered 2023-02-11: 1500 mg via INTRAVENOUS
  Filled 2023-02-11: qty 100

## 2023-02-11 NOTE — ED Provider Notes (Signed)
[facility] Emergency Department Provider Note MRN:  [id number]  Arrival date & time: 02/11/23     Chief Complaint   Seizures   History of Present Illness   Tyler Dominguez is a 42 y.o. year-old male presents to the ED with chief complaint of seizures.  Patient reportedly had 2 witnessed seizures.  The first lasted approximately 30 seconds, but then he was able to tell his mother that he was okay.  He then had another seizure that lasted approximately 5 minutes.  He was given a total of 5 mg IM Versed by EMS followed by another dose of 2.5 mg IV Versed.  He is accompanied by his mother, he states that he has been out of his seizure medications because he was dropped from his neurology practice for missing too many appointments.  He takes Keppra 500 mg twice daily, but has not been taking this for quite some time due to not having any.  Mother states that he has not been sick or having any other symptoms lately.  History provided by mother and EMS   Review of Systems  Pertinent positive and negative review of systems noted in HPI.    Physical Exam   Vitals:   02/11/23 0500 02/11/23 0530  BP: (!) 146/88 125/85  Pulse: 83 84  Resp: 16 16  Temp:    SpO2: 100% 99%    CONSTITUTIONAL:  post ictal-appearing NEURO:  Post ictal EYES:  eyes equal and reactive ENT/NECK:  Supple, no stridor  CARDIO:  tachycardic, regular rhythm, appears well-perfused  PULM:  No respiratory distress,  GI/GU:  non-distended,  MSK/SPINE:  No gross deformities, no edema, moves all extremities  SKIN:  no rash, atraumatic   *Additional and/or pertinent findings included in MDM below  Diagnostic and Interventional Summary    EKG Interpretation Date/Time:  Tuesday February 11 2023 00:47:47 EST Ventricular Rate:  117 PR Interval:  148 QRS Duration:  85 QT Interval:  318 QTC Calculation: 444 R Axis:   55  Text Interpretation: Sinus tachycardia RSR' in V1 or V2, probably normal  variant Borderline T wave abnormalities Confirmed by Tilden Fossa [id number]) on 02/11/2023 4:48:17 AM       Labs Reviewed  CBC - Abnormal; Notable for the following components:      Result Value   Hemoglobin 9.8 (*)    HCT 33.1 (*)    MCV 77.9 (*)    MCH 23.1 (*)    MCHC 29.6 (*)    RDW 19.0 (*)    All other components within normal limits  BASIC METABOLIC PANEL - Abnormal; Notable for the following components:   Potassium 3.0 (*)    CO2 14 (*)    Calcium 8.3 (*)    Anion gap 19 (*)    All other components within normal limits  CBG MONITORING, ED - Abnormal; Notable for the following components:   Glucose-Capillary 100 (*)    All other components within normal limits    No orders to display    Medications  levETIRAcetam (KEPPRA) IVPB 1500 mg/ 100 mL premix (0 mg Intravenous Stopped 02/11/23 0109)     Procedures  /  Critical Care Procedures  ED Course and Medical Decision Making  I have reviewed the triage vital signs, the nursing notes, and pertinent available records from the EMR.  Social Determinants Affecting Complexity of Care: Patient has no clinically significant social determinants affecting this chief complaint..   ED Course:    Medical  Decision Making Patient here after having had 2 seizures.  He was given 7.5 mg of versed by EMS.  He is post-ictal vs sedated from the versed now.  Mother states that he hasn't had his Keppra due to financial reasons.    Labs are reassuring.  Will reassess.  Can go home once back to baseline.  I reassessed the patient multiple times throughout the night.  At around 5am, the patient's mother said that he was back to normal and ready to go.  On my repeat assessment, he is alert, oriented, and answering all questions appropriately.  I find him stable for discharge.  I'll refill his Keppra.  Amount and/or Complexity of Data Reviewed Labs: ordered.  Risk Prescription drug management.         Consultants: No  consultations were needed in caring for this patient.   Treatment and Plan: I considered admission due to patient's initial presentation, but after considering the examination and diagnostic results, patient will not require admission and can be discharged with outpatient follow-up.    Final Clinical Impressions(s) / ED Diagnoses     ICD-10-CM   1. Seizure (HCC)  R56.9 Ambulatory referral to Neurology      ED Discharge Orders          Ordered    levETIRAcetam (KEPPRA) 500 MG tablet  2 times daily        02/11/23 0525    Ambulatory referral to Neurology       Comments: An appointment is requested in approximately: 2 weeks   02/11/23 0525              Discharge Instructions Discussed with and Provided to Patient:   Discharge Instructions   None      Roxy Horseman, PA-C 02/11/23 0556    Tilden Fossa, MD 02/11/23 [phone]

## 2023-02-11 NOTE — ED Triage Notes (Signed)
Pt with hx of seizures ran out of medications and had a 30 second tonic clonic seizure at home. With EMS he had right eye deviation and stiffening. He got 5 mg of Versed IM and 2.5 of Versed IV when they got access. Sinus tach on monitor. He is currently post ictal.
